# Patient Record
Sex: Male | Born: 1985 | Race: White | Hispanic: No | Marital: Single | Smoking: Never smoker
Health system: Southern US, Community
[De-identification: ages and names within clinical notes are randomized; demographics above are authoritative.]

---
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
## 2007-02-03 ENCOUNTER — Emergency Department (HOSPITAL_COMMUNITY): Admission: EM | Admit: 2007-02-03 | Discharge: 2007-02-03 | Payer: Self-pay | Admitting: Family Medicine

## 2007-04-03 ENCOUNTER — Ambulatory Visit: Payer: Self-pay | Admitting: Family Medicine

## 2007-04-04 LAB — CONVERTED CEMR LAB
Basophils Relative: 0.4 % (ref 0.0–1.0)
CO2: 31 meq/L (ref 19–32)
Calcium: 9.4 mg/dL (ref 8.4–10.5)
Eosinophils Relative: 2.6 % (ref 0.0–5.0)
GFR calc Af Amer: 99 mL/min
Glucose, Bld: 101 mg/dL — ABNORMAL HIGH (ref 70–99)
HCT: 50.2 % (ref 39.0–52.0)
Hemoglobin: 16.5 g/dL (ref 13.0–17.0)
Lymphocytes Relative: 29.1 % (ref 12.0–46.0)
MCV: 86.7 fL (ref 78.0–100.0)
Neutro Abs: 5 10*3/uL (ref 1.4–7.7)
Neutrophils Relative %: 57.9 % (ref 43.0–77.0)
Platelets: 193 10*3/uL (ref 150–400)
Sodium: 140 meq/L (ref 135–145)
WBC: 8.4 10*3/uL (ref 4.5–10.5)

## 2007-09-16 ENCOUNTER — Emergency Department (HOSPITAL_COMMUNITY): Admission: EM | Admit: 2007-09-16 | Discharge: 2007-09-16 | Payer: Self-pay | Admitting: Emergency Medicine

## 2007-09-23 ENCOUNTER — Ambulatory Visit: Payer: Self-pay | Admitting: Family Medicine

## 2007-09-24 ENCOUNTER — Encounter: Payer: Self-pay | Admitting: Family Medicine

## 2008-03-06 ENCOUNTER — Emergency Department (HOSPITAL_COMMUNITY): Admission: EM | Admit: 2008-03-06 | Discharge: 2008-03-06 | Payer: Self-pay | Admitting: Emergency Medicine

## 2008-08-09 ENCOUNTER — Emergency Department: Payer: Self-pay | Admitting: Emergency Medicine

## 2008-08-10 ENCOUNTER — Encounter (INDEPENDENT_AMBULATORY_CARE_PROVIDER_SITE_OTHER): Payer: Self-pay | Admitting: *Deleted

## 2008-09-17 ENCOUNTER — Encounter: Payer: Self-pay | Admitting: Family Medicine

## 2009-02-24 ENCOUNTER — Ambulatory Visit: Payer: Self-pay | Admitting: Family Medicine

## 2009-02-24 DIAGNOSIS — F988 Other specified behavioral and emotional disorders with onset usually occurring in childhood and adolescence: Secondary | ICD-10-CM | POA: Insufficient documentation

## 2009-02-24 HISTORY — DX: Other specified behavioral and emotional disorders with onset usually occurring in childhood and adolescence: F98.8

## 2009-03-21 ENCOUNTER — Ambulatory Visit: Payer: Self-pay | Admitting: Family Medicine

## 2009-03-21 LAB — CONVERTED CEMR LAB
Blood in Urine, dipstick: NEGATIVE
Ketones, urine, test strip: NEGATIVE
Nitrite: NEGATIVE
Urobilinogen, UA: 0.2
WBC Urine, dipstick: NEGATIVE

## 2009-04-25 ENCOUNTER — Telehealth: Payer: Self-pay | Admitting: Family Medicine

## 2009-08-24 ENCOUNTER — Telehealth: Payer: Self-pay | Admitting: Family Medicine

## 2009-08-30 ENCOUNTER — Emergency Department (HOSPITAL_COMMUNITY): Admission: EM | Admit: 2009-08-30 | Discharge: 2009-08-30 | Payer: Self-pay | Admitting: Family Medicine

## 2009-11-24 ENCOUNTER — Ambulatory Visit: Payer: Self-pay | Admitting: Family Medicine
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
## 2010-02-01 ENCOUNTER — Telehealth: Payer: Self-pay | Admitting: Family Medicine

## 2010-02-21 NOTE — Letter (Signed)
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

## 2010-02-21 NOTE — Progress Notes (Signed)
Summary: ritallin  Phone Note Refill Request Call back at [phone] Message from:  Patient on August 24, 2009 3:57 PM  Refills Requested: Medication #1:  METHYLPHENIDATE HCL CR 20 MG CR-TABS 1 by mouth daily.  Method Requested: Pick up at Office Initial call taken by: Melody Comas,  August 24, 2009 3:57 PM    Prescriptions: METHYLPHENIDATE HCL CR 20 MG CR-TABS (METHYLPHENIDATE HCL) 1 by mouth daily  #30 x 0   Entered and Authorized by:   Hannah Beat MD   Signed by:   Hannah Beat MD on 08/29/2009   Method used:   Print then Give to Patient   RxID:   [id number]

## 2010-02-21 NOTE — Assessment & Plan Note (Signed)
Summary: CDL PHYSICAL/CLE   Vital Signs:  Patient profile:   25 year old male Height:      76.25 inches Weight:      211.13 pounds BMI:     25.62 Temp:     98.3 degrees F oral Pulse rate:   80 / minute Pulse rhythm:   regular BP sitting:   138 / 88  (right arm) Cuff size:   regular  Vitals Entered By: Linde Gillis CMA Duncan Dull) (March 21, 2009 2:33 PM)  Vision Screening:Left eye w/o correction: 20 / 15 Right Eye w/o correction: 20 / 15 Both eyes w/o correction:  20/ 15  Color vision testing: normal      Vision Entered By: Linde Gillis CMA Duncan Dull) (March 21, 2009 2:43 PM)  20db HL: Left  500 hz: 20db 1000 hz: 20db 2000 hz: 20db 4000 hz: 20db Right  500 hz: 20db 1000 hz: 20db 2000 hz: 20db 4000 hz: 20db     History of Present Illness: 25 yo:  Here for CPX  Feeling fine, essentially no complaints.  Preventive Screening-Counseling & Management  Alcohol-Tobacco     Alcohol drinks/day: <1     Alcohol Counseling: not indicated; patient does not drink     Tobacco Counseling: not indicated; no tobacco use  Caffeine-Diet-Exercise     Diet Comments: good diet     Diet Counseling: not indicated; diet is assessed to be healthy     Does Patient Exercise: yes     Exercise Counseling: not indicated; exercise is adequate  Hep-HIV-STD-Contraception     HIV Risk: no risk noted     STD Risk: no risk noted     Dental Care Counseling: to seek dental care; no dental care within six months     Testicular SE Education/Counseling to perform regular STE     Sun Exposure-Excessive: yes      Sexual History:  currently monogamous.        Drug Use:  never.    Clinical Review Panels:  Immunizations   Last Tetanus Booster:  Td (11/23/1998)  CBC   WBC:  8.4 (04/03/2007)   RBC:  5.79 (04/03/2007)   Hgb:  16.5 (04/03/2007)   Hct:  50.2 (04/03/2007)   Platelets:  193 (04/03/2007)   MCV  86.7 (04/03/2007)   MCHC  32.8 (04/03/2007)   RDW  13.6 (04/03/2007)   PMN:  57.9  (04/03/2007)   Lymphs:  29.1 (04/03/2007)   Monos:  10.0 (04/03/2007)   Eosinophils:  2.6 (04/03/2007)   Basophil:  0.4 (04/03/2007)  Complete Metabolic Panel   Glucose:  101 (04/03/2007)   Sodium:  140 (04/03/2007)   Potassium:  3.8 (04/03/2007)   Chloride:  102 (04/03/2007)   CO2:  31 (04/03/2007)   BUN:  12 (04/03/2007)   Creatinine:  1.2 (04/03/2007)   Calcium:  9.4 (04/03/2007)   Allergies: 1)  ! Amoxicillin  Past History:  Past medical, surgical, family and social histories (including risk factors) reviewed, and no changes noted (except as noted below).  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 Tooth Extraction 1997  Family History: Reviewed history from 09/24/2007 and no changes required. Father:A  46 yoa Mother:A  41 yoa Sister A 13 yoa CV: NEGATIVE HBP: NEGATIVE DM: + MGM GOUT/ARTHRITIS: prostate cancer:   + lung cancer pgf (smoker) BREAST/OVARIAN/ UTERINE CANCER: COLON CANCER: DEPRESSION: NEGATIVE ETOH/DRUG ABUSE: NEGATIVE OTHER: NEGATIVE STROKE  Social History:  Reviewed history from 04/03/2007 and no changes required. 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  Cardiovascular: Denies chest pains, palpitations, syncope, dyspnea on exertion,  Respiratory: Denies cough, dyspnea at rest, excessive sputum,wheeezing GI: Denies nausea, vomiting, diarrhea, constipation, change in bowel habits, abdominal pain,  melena, hematochezia GU: Denies dysuria, hematuria, discharge, urinary frequency, urinary hesitancy, nocturia, incontinence, genital sores, decreased libido Musculoskeletal: Denies back pain, joint pain Derm: Denies rash, itching Neuro: Denies  paresthesias, frequent falls, frequent headaches, and difficulty walking.  Psych: Denies depression, anxiety Endocrine: Denies cold intolerance, heat intolerance, polydipsia, polyphagia, polyuria, and unusual weight change.  Heme: Denies enlarged lymph nodes Allergy: No hayfever   Otherwise, the pertinent positives and negatives are listed above and in the HPI, otherwise a full review of systems has been reviewed and is negative unless noted positive.    Impression & Recommendations:  Problem # 1:  HEALTH MAINTENANCE EXAM (ICD-V70.0)  The patient's preventative maintenance and recommended screening tests for an annual wellness exam were reviewed in full today. Brought up to date unless services declined.  Counselled on the importance of diet, exercise, and its role in overall health and mortality. The patient's FH and SH was reviewed, including their home life, tobacco status, and drug and alcohol status.   doing well, completed forms for him.  Orders: UA Dipstick w/o Micro (manual) (91478)  Complete Medication List: 1)  Methylphenidate Hcl Cr 20 Mg Cr-tabs (Methylphenidate hcl) .Marland Kitchen.. 1 by mouth daily   Prior Medications (reviewed today): None Current Allergies (reviewed today): ! AMOXICILLIN  Laboratory Results   Urine Tests   Date/Time Reported: March 21, 2009 2:54 PM   Routine Urinalysis   Color: yellow Appearance: Clear Glucose: negative   (Normal Range: Negative) Bilirubin: negative   (Normal Range: Negative) Ketone: negative   (Normal Range: Negative) Spec. Gravity: 1.015   (Normal Range: 1.003-1.035) Blood: negative   (Normal Range: Negative) pH: 6.5   (Normal Range: 5.0-8.0) Protein: trace   (Normal Range:  Negative) Urobilinogen: 0.2   (Normal Range: 0-1) Nitrite: negative   (Normal Range: Negative) Leukocyte Esterace: negative   (Normal Range: Negative)      Physical Exam General Appearance: well developed, well nourished, no acute distress Eyes: conjunctiva and lids normal, PERRLA, EOMI Ears, Nose, Mouth, Throat: TM clear, nares clear, oral exam WNL Neck: supple, no lymphadenopathy, no thyromegaly, no JVD Respiratory: clear to auscultation and percussion, respiratory effort normal Cardiovascular: regular rate and rhythm, S1-S2, no murmur, rub or gallop, no bruits, peripheral pulses normal and symmetric, no cyanosis, clubbing, edema or varicosities Chest: no scars, masses, tenderness; no asymmetry, skin changes, nipple discharge, no gynecomastia   Gastrointestinal: soft, non-tender; no hepatosplenomegaly, masses; active bowel sounds all quadrants,  Genitourinary: no hernia, testicular mass, penile discharge, priapism  Lymphatic: no cervical, axillary or inguinal adenopathy Musculoskeletal: gait normal, muscle tone and strength WNL, no joint swelling, effusions, discoloration, crepitus  Skin: clear, good turgor, color WNL, no rashes, lesions, or ulcerations Neurologic: normal mental status, normal reflexes, normal strength, sensation, and motion Psychiatric: alert; oriented to person, place and time Other Exam:

## 2010-02-21 NOTE — Letter (Signed)
Summary: ADHD Questionnaire/[facility] [facility]  ADHD Questionnaire/[facility]   Imported By: Lanelle Bal 03/01/2009 12:38:49  _____________________________________________________________________  External Attachment:    Type:   Image     Comment:   External Document

## 2010-02-21 NOTE — Assessment & Plan Note (Signed)
Summary: F/U ON RITLIN/DLO   Vital Signs:  Patient profile:   25 year old male Height:      76.25 inches Weight:      206.0 pounds BMI:     25.00 Temp:     99.0 degrees F oral Pulse rate:   84 / minute Pulse rhythm:   regular BP sitting:   110 / 78  (left arm) Cuff size:   regular  Vitals Entered By: Benny Lennert CMA Duncan Dull) (November 24, 2009 3:24 PM)  History of Present Illness: Chief complaint follow up ritalin  25 year old male:  Was not sleeping and was not eating. Felt spacy and out of it all the time.  Lost weight.  Was  Allergies: 1)  ! Amoxicillin   Impression & Recommendations:  Problem # 1:  ADD (ICD-314.00) >15 minutes spent in face to face time with patient, >50% spent in counselling or coordination of care: patient did do better from an irritability standpoint, concentration standpoint, doing all his work better when  taking the Ritalin, however he did have to many side effects, decreased sleep, weight loss, loss of appetite. This is a 20 mg dose.  We're to cut this down to 10 mg p.o. daily  with the extended release version. Followup in 2 months.  Complete Medication List: 1)  Methylphenidate Hcl 10 Mg Cr-tabs (Methylphenidate hcl) .Marland Kitchen.. 1 by mouth each morning  Patient Instructions: 1)  recheck 2 months Prescriptions: METHYLPHENIDATE HCL 10 MG CR-TABS (METHYLPHENIDATE HCL) 1 by mouth each morning  #30 x 0   Entered and Authorized by:   Hannah Beat MD   Signed by:   Hannah Beat MD on 11/24/2009   Method used:   Print then Give to Patient   RxID:   [id number]    Orders Added: 1)  Est. Patient Level III [14782]    Prior Medications (reviewed today): None Current Allergies (reviewed today): ! AMOXICILLIN

## 2010-02-21 NOTE — Assessment & Plan Note (Signed)
Summary: TEST FOR ADHD/CLE R/S FROM 02/14/09   Vital Signs:  Patient profile:   25 year old male Weight:      214.6 pounds BMI:     17.14 Temp:     98.1 degrees F oral Pulse rate:   84 / minute Pulse rhythm:   regular BP sitting:   90 / 60  (left arm) Cuff size:   large  Vitals Entered By: Linde Gillis CMA Duncan Dull) (February 24, 2009 3:47 PM) CC: test for ADHD   History of Present Illness: 25 year old male:  Just going to [employer], having some difficuty focusing and thinking and getting into them. Will start thinking about   If interested will be able to read  Moved out and living with a friend.   Current Problems (verified): 1)  Add  (ICD-314.00)  Allergies: 1)  ! Amoxicillin  Past History:  Past medical, surgical, family and social histories (including risk factors) reviewed, and no changes noted (except as noted below).  Past Medical History: ADD  Past Surgical History: Reviewed history from 09/24/2007 and no changes required. Tooth Extraction 1997  Family History: Reviewed history from 09/24/2007 and no changes required. Father:A  46 yoa Mother:A  4 yoa Sister A 13 yoa CV: NEGATIVE HBP: NEGATIVE DM: + MGM GOUT/ARTHRITIS: prostate cancer:   + lung cancer pgf (smoker) BREAST/OVARIAN/ UTERINE CANCER: COLON CANCER: DEPRESSION: NEGATIVE ETOH/DRUG ABUSE: NEGATIVE OTHER: NEGATIVE STROKE  Social History: Reviewed history from 04/03/2007 and no changes required. 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  The patient's adult ADD questionnaire is reviewed, and it is significantly positive. He is recently started  college at [employer], and he is having a great deal of difficulty focusing, and maintaining his focusing on schoolwork. He is had this problem since he was quite young throughout school age, and was told younger than he did likely have ADD. He and his parents never pursued this. 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 He is unable to concentrate reading, paying attention in class, and doing schoolwork.  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  Will start low-dose stimulants and have him followup to evaluate their impact.  Complete Medication List: 1)  Methylphenidate Hcl Cr 20 Mg Cr-tabs (Methylphenidate hcl) .Marland Kitchen.. 1 by mouth daily Prescriptions: METHYLPHENIDATE HCL CR 20 MG CR-TABS (METHYLPHENIDATE HCL) 1 by mouth daily  #30 x 0   Entered and Authorized by:   Hannah Beat MD   Signed by:   Hannah Beat MD on 02/24/2009   Method used:   Print then Give to Patient   RxID:   [id number]   Current Allergies (reviewed today): ! AMOXICILLIN

## 2010-02-21 NOTE — Progress Notes (Signed)
Summary: needs refill on ritalin  Phone Note Refill Request Message from:  mother  Refills Requested: Medication #1:  METHYLPHENIDATE HCL CR 20 MG CR-TABS 1 by mouth daily. Please call pt at [phone] when script is ready.  Initial call taken by: Lowella Petties CMA,  April 25, 2009 9:36 AM    Prescriptions: METHYLPHENIDATE HCL CR 20 MG CR-TABS (METHYLPHENIDATE HCL) 1 by mouth daily  #30 x 0   Entered and Authorized by:   Hannah Beat MD   Signed by:   Hannah Beat MD on 04/25/2009   Method used:   Print then Give to Patient   RxID:   [id number]   Appended Document: needs refill on ritalin Left message on cell phone voicemail that Rx ready for pick up, will be left at front desk.

## 2010-02-23 NOTE — Progress Notes (Signed)
Summary: refill request for ritalin  Phone Note Refill Request Call back at [phone] Message from:  mother  Jearld Fenton Requested: Medication #1:  METHYLPHENIDATE HCL 10 MG CR-TABS 1 by mouth each morning. Please call when ready.  Initial call taken by: Lowella Petties CMA, AAMA,  February 01, 2010 3:24 PM    Prescriptions: METHYLPHENIDATE HCL 10 MG CR-TABS (METHYLPHENIDATE HCL) 1 by mouth each morning  #30 x 0   Entered and Authorized by:   Hannah Beat MD   Signed by:   Hannah Beat MD on 02/01/2010   Method used:   Print then Give to Patient   RxID:   [id number]

## 2010-04-04 ENCOUNTER — Telehealth: Payer: Self-pay | Admitting: Family Medicine

## 2010-04-10 ENCOUNTER — Telehealth: Payer: Self-pay | Admitting: Family Medicine

## 2010-04-11 NOTE — Progress Notes (Signed)
Summary: needs refill on ritalin  Phone Note Refill Request Call back at Home Phone [phone] Message from:  Patient  Refills Requested: Medication #1:  METHYLPHENIDATE HCL 10 MG CR-TABS 1 by mouth each morning. Please call when ready.  OK to wait till tomorrow.  Initial call taken by: Lowella Petties CMA, AAMA,  April 04, 2010 3:02 PM    Prescriptions: METHYLPHENIDATE HCL 10 MG CR-TABS (METHYLPHENIDATE HCL) 1 by mouth each morning  #30 x 0   Entered and Authorized by:   Hannah Beat MD   Signed by:   Hannah Beat MD on 04/05/2010   Method used:   Print then Give to Patient   RxID:   [id number]

## 2010-04-20 NOTE — Progress Notes (Signed)
Summary: clarification needed for ritalin script  Phone Note From Pharmacy   Caller: [facility]  [phone] Summary of Call: Pharmacy is asking for clarification on ritalin script, script was written for CR, pharmacist says this doesnt come in CR.              Lowella Petties CMA, AAMA  April 10, 2010 9:49 AM   Follow-up for Phone Call        converted to capsule form per phamD - a generic capsule exists Follow-up by: Hannah Beat MD,  April 10, 2010 10:36 AM    New/Updated Medications: METADATE CD 10 MG CR-CAPS (METHYLPHENIDATE HCL) 1 capsule by mouth daily Prescriptions: METADATE CD 10 MG CR-CAPS (METHYLPHENIDATE HCL) 1 capsule by mouth daily  #30 x 0   Entered and Authorized by:   Hannah Beat MD   Signed by:   Hannah Beat MD on 04/10/2010   Method used:   Historical   RxID:   [id number]

## 2010-10-12 LAB — POCT INFECTIOUS MONO SCREEN: Mono Screen: POSITIVE — AB

## 2012-01-17 ENCOUNTER — Telehealth: Payer: Self-pay

## 2012-01-17 NOTE — Telephone Encounter (Signed)
rx called to pharmacy 

## 2012-01-17 NOTE — Telephone Encounter (Signed)
Transdermal Scopolamine Patch Apply 1 patch q 72 hours prn motion sickness Disp: #4, 1 refills  

## 2012-01-17 NOTE — Telephone Encounter (Signed)
pts mother left note requesting rx for motion sickness patch to [facility] pharmacy; leaving 01/18/12 for 6 day cruise. Pt last seen 11/24/09.Please advise. Pts mother request call back when med sent to pharmacy.

## 2012-03-10 ENCOUNTER — Ambulatory Visit (INDEPENDENT_AMBULATORY_CARE_PROVIDER_SITE_OTHER): Payer: 59 | Admitting: Family Medicine

## 2012-03-10 ENCOUNTER — Encounter: Payer: Self-pay | Admitting: Family Medicine

## 2012-03-10 VITALS — BP 112/76 | HR 62 | Temp 97.6°F | Ht 76.25 in | Wt 209.5 lb

## 2012-03-10 DIAGNOSIS — Z13220 Encounter for screening for lipoid disorders: Secondary | ICD-10-CM

## 2012-03-10 DIAGNOSIS — S63659A Sprain of metacarpophalangeal joint of unspecified finger, initial encounter: Secondary | ICD-10-CM

## 2012-03-10 DIAGNOSIS — S63641A Sprain of metacarpophalangeal joint of right thumb, initial encounter: Secondary | ICD-10-CM

## 2012-03-10 DIAGNOSIS — Z209 Contact with and (suspected) exposure to unspecified communicable disease: Secondary | ICD-10-CM

## 2012-03-10 DIAGNOSIS — Z0000 Encounter for general adult medical examination without abnormal findings: Secondary | ICD-10-CM

## 2012-03-10 DIAGNOSIS — Z131 Encounter for screening for diabetes mellitus: Secondary | ICD-10-CM

## 2012-03-10 LAB — LIPID PANEL
HDL: 39.9 mg/dL (ref >39.00)
LDL Cholesterol: 103 mg/dL — ABNORMAL HIGH (ref 0–99)
Total CHOL/HDL Ratio: 4
Triglycerides: 100 mg/dL (ref 0.0–149.0)
VLDL: 20 mg/dL (ref 0.0–40.0)

## 2012-03-10 LAB — BASIC METABOLIC PANEL
CO2: 29 mEq/L (ref 19–32)
Chloride: 106 mEq/L (ref 96–112)
Sodium: 141 mEq/L (ref 135–145)
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
## 2012-03-11 ENCOUNTER — Encounter: Payer: Self-pay | Admitting: *Deleted

## 2012-11-20 ENCOUNTER — Ambulatory Visit (INDEPENDENT_AMBULATORY_CARE_PROVIDER_SITE_OTHER): Payer: 59 | Admitting: Family Medicine

## 2012-11-20 ENCOUNTER — Encounter: Payer: Self-pay | Admitting: Family Medicine

## 2012-11-20 VITALS — BP 110/70 | HR 75 | Temp 98.3°F | Ht 76.25 in | Wt 211.5 lb

## 2012-11-20 DIAGNOSIS — S29012A Strain of muscle and tendon of back wall of thorax, initial encounter: Secondary | ICD-10-CM

## 2012-11-20 DIAGNOSIS — S239XXA Sprain of unspecified parts of thorax, initial encounter: Secondary | ICD-10-CM

## 2012-11-20 MED ORDER — OXAPROZIN 600 MG PO TABS: 1200.0000 mg | ORAL_TABLET | Freq: Every day | ORAL | Status: DC

## 2012-11-20 MED ORDER — CYCLOBENZAPRINE HCL 10 MG PO TABS: 5.0000 mg | ORAL_TABLET | Freq: Every day | ORAL | Status: DC

## 2012-11-20 NOTE — Progress Notes (Signed)
Date:  11/20/2012   Name:  Isaac Fisher   DOB:  April 11, 1985   MRN:  [id number] Gender: male Age: 27 y.o.  Primary Physician:  Hannah Beat, MD   Chief Complaint: Shoulder Pain   History of Present Illness:  Isaac Fisher is a 27 y.o. very pleasant male patient who presents with the following:  L  Over the last year and a half, has had a  Pain like pulling a guitar string in shoulder. Sometimes if aggravated, then it will hurt when doing something. Position will make it hurt. Framing houses will hurt it. All posterior shoulder blade area. No specific injury or fall. No numbness or tingling.  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   Past Medical History, Surgical History, Social History, Family History, Problem List, Medications, and Allergies have been reviewed and updated if relevant.  No current outpatient prescriptions on file prior to visit.   No current facility-administered medications on file prior to visit.    Review of Systems:  GEN: No fevers, chills. Nontoxic. Primarily MSK c/o today. MSK: Detailed in the HPI GI: tolerating PO intake without difficulty Neuro: No numbness, parasthesias, or tingling associated. Otherwise the pertinent positives of the ROS are noted above.    Physical Examination: BP 110/70  Pulse 75  Temp(Src) 98.3 F (36.8 C) (Oral)  Ht 6' 4.25" (1.937 m)  Wt 211 lb 8 oz (95.936 kg)  BMI 25.57 kg/m2   GEN: WDWN, NAD, Non-toxic, Alert & Oriented x 3 HEENT: Atraumatic, Normocephalic.  Ears and Nose: No external deformity. EXTR: No clubbing/cyanosis/edema NEURO: Normal gait.  PSYCH: Normally interactive. Conversant. Not depressed or anxious appearing.  Calm demeanor.   Shoulder: L Inspection: No muscle wasting Ecchymosis/edema: neg  AC joint, scapula, clavicle: NT Cervical spine: NT, full ROM Spurling's: neg Abduction: full, 5/5 Flexion: full, 5/5 IR, full, lift-off: 5/5 ER at  neutral: full, 5/5 AC crossover and compression: neg Neer: neg Hawkins: neg Drop Test: neg Empty Can: neg Supraspinatus insertion: NT Bicipital groove: NT Speed's: neg Yergason's: neg Sulcus sign: neg Scapular dyskinesis: MARKED PAIN ADJACENT TO SCAPULA IN LOWER TRAP OR UPPER RHOMBOID.  C5-T1 intact Sensation intact Grip 5/5   Assessment and Plan:  Rhomboid muscle strain, initial encounter  I suspect all overuse from occupation. Trap/rhomboid  I reviewed a good scapular stabilization program, too.  There are no Patient Instructions on file for this visit.  Orders Today:  No orders of the defined types were placed in this encounter.    Updated Medication List: (Includes new medications, updates to list, dose adjustments) Meds ordered this encounter  Medications  . oxaprozin (DAYPRO) 600 MG tablet    Sig: Take 2 tablets (1,200 mg total) by mouth daily.    Dispense:  60 tablet    Refill:  2  . cyclobenzaprine (FLEXERIL) 10 MG tablet    Sig: Take 0.5-1 tablets (5-10 mg total) by mouth at bedtime.    Dispense:  30 tablet    Refill:  2    Updated Medication List:   Medication List       This list is accurate as of: 11/20/12 11:59 PM.  Always use your most recent med list.               cyclobenzaprine 10 MG tablet  Commonly known as:  FLEXERIL  Take 0.5-1 tablets (5-10 mg total) by mouth at bedtime.     oxaprozin 600 MG tablet  Commonly known as:  DAYPRO  Take 2 tablets (1,200 mg total) by mouth daily.          Signed,  Elpidio Galea. Mikkel Charrette, MD, CAQ Sports Medicine  [facility] [address] [place] [address] Phone: [phone] Fax: [phone]

## 2013-02-18 ENCOUNTER — Telehealth: Payer: Self-pay | Admitting: Family Medicine

## 2013-02-18 ENCOUNTER — Encounter: Payer: Self-pay | Admitting: Family Medicine

## 2013-02-18 NOTE — Telephone Encounter (Signed)
Pt requesting last physical or a letter to give clearance saying it's ok to perform his job normally (he is a [employer] architecttruck driver) to be faxed to Dr. Katharina CaperBeverly Knott at [phone].

## 2013-02-18 NOTE — Telephone Encounter (Signed)
Letter faxed to Dr. Katharina CaperBeverly Knott at [phone].

## 2013-02-18 NOTE — Telephone Encounter (Signed)
i wrote this letter, but i had trouble printing. Please print and help send. thahnks.

## 2015-11-18 ENCOUNTER — Encounter: Payer: Self-pay | Admitting: Primary Care

## 2015-11-18 ENCOUNTER — Ambulatory Visit (INDEPENDENT_AMBULATORY_CARE_PROVIDER_SITE_OTHER): Payer: BLUE CROSS/BLUE SHIELD | Admitting: Primary Care

## 2015-11-18 ENCOUNTER — Telehealth: Payer: Self-pay | Admitting: Radiology

## 2015-11-18 VITALS — BP 130/84 | HR 72 | Temp 98.4°F | Ht 76.25 in | Wt 227.1 lb

## 2015-11-18 DIAGNOSIS — R197 Diarrhea, unspecified: Secondary | ICD-10-CM

## 2015-11-18 DIAGNOSIS — R112 Nausea with vomiting, unspecified: Secondary | ICD-10-CM | POA: Diagnosis not present

## 2015-11-18 LAB — CBC WITH DIFFERENTIAL/PLATELET
Basophils Absolute: 0 10*3/uL (ref 0.0–0.1)
Basophils Relative: 0.1 % (ref 0.0–3.0)
EOS PCT: 0.8 % (ref 0.0–5.0)
Eosinophils Absolute: 0.1 10*3/uL (ref 0.0–0.7)
HCT: 53.8 % — ABNORMAL HIGH (ref 39.0–52.0)
Hemoglobin: 18.4 g/dL (ref 13.0–17.0)
LYMPHS ABS: 0.8 10*3/uL (ref 0.7–4.0)
Lymphocytes Relative: 8.9 % — ABNORMAL LOW (ref 12.0–46.0)
MCHC: 34.1 g/dL (ref 30.0–36.0)
MCV: 88.2 fl (ref 78.0–100.0)
MONO ABS: 0.9 10*3/uL (ref 0.1–1.0)
Monocytes Relative: 9.3 % (ref 3.0–12.0)
NEUTROS PCT: 80.9 % — AB (ref 43.0–77.0)
Neutro Abs: 7.4 10*3/uL (ref 1.4–7.7)
Platelets: 154 10*3/uL (ref 150.0–400.0)
RBC: 6.1 Mil/uL — AB (ref 4.22–5.81)
RDW: 13.2 % (ref 11.5–15.5)
WBC: 9.2 10*3/uL (ref 4.0–10.5)

## 2015-11-18 LAB — COMPREHENSIVE METABOLIC PANEL
ALK PHOS: 69 U/L (ref 39–117)
ALT: 34 U/L (ref 0–53)
AST: 33 U/L (ref 0–37)
Albumin: 4.6 g/dL (ref 3.5–5.2)
BUN: 15 mg/dL (ref 6–23)
CO2: 30 mEq/L (ref 19–32)
Calcium: 9.5 mg/dL (ref 8.4–10.5)
Chloride: 98 mEq/L (ref 96–112)
Creatinine, Ser: 1.22 mg/dL (ref 0.40–1.50)
GFR: 74.74 mL/min (ref >60.00)
GLUCOSE: 103 mg/dL — AB (ref 70–99)
POTASSIUM: 4.4 meq/L (ref 3.5–5.1)
SODIUM: 135 meq/L (ref 135–145)
TOTAL PROTEIN: 7.9 g/dL (ref 6.0–8.3)
Total Bilirubin: 0.5 mg/dL (ref 0.2–1.2)

## 2015-11-18 MED ORDER — ONDANSETRON 4 MG PO TBDP: 4.0000 mg | ORAL_TABLET | Freq: Three times a day (TID) | ORAL | 0 refills | Status: DC | PRN

## 2015-11-18 NOTE — Telephone Encounter (Signed)
Most likely from acute illness. No prior history. Will monitor symptoms.

## 2015-11-18 NOTE — Telephone Encounter (Signed)
I would suspect this would be from acute illness, diarrhea, etc - I will send to Mrs. Clark who saw him.

## 2015-11-18 NOTE — Telephone Encounter (Signed)
[facility] called a critical HGB - 18.2. Results given to Vernona RiegerKatherine Clark, NP

## 2015-11-18 NOTE — Patient Instructions (Signed)
Your symptoms are representative of a viral illness which will resolve on its own over time. Our goal is to treat your symptoms in order to aid your body in the healing process and to make you more comfortable.   Start Zofran (ondansetron) 4 mg tablets for nausea. Melt 1 tablet in your mouth every 8 hours as needed for nausea/vomiting.  Ensure you are staying hydrated and advance your diet as tolerated. Take a look at the information below regarding your diet.  Complete lab work prior to leaving today. I will notify you of your results once received.   Please go to the hospital if you start to vomit, your diarrhea does not improve, you cannot drink any fluids. Avoid medications like Imodium as discussed.   It was a pleasure meeting you!    Food Choices to Help Relieve Diarrhea, Adult When you have diarrhea, the foods you eat and your eating habits are very important. Choosing the right foods and drinks can help relieve diarrhea. Also, because diarrhea can last up to 7 days, you need to replace lost fluids and electrolytes (such as sodium, potassium, and chloride) in order to help prevent dehydration.  WHAT GENERAL GUIDELINES DO I NEED TO FOLLOW?  Slowly drink 1 cup (8 oz) of fluid for each episode of diarrhea. If you are getting enough fluid, your urine will be clear or pale yellow.  Eat starchy foods. Some good choices include white rice, white toast, pasta, low-fiber cereal, baked potatoes (without the skin), saltine crackers, and bagels.  Avoid large servings of any cooked vegetables.  Limit fruit to two servings per day. A serving is  cup or 1 small piece.  Choose foods with less than 2 g of fiber per serving.  Limit fats to less than 8 tsp (38 g) per day.  Avoid fried foods.  Eat foods that have probiotics in them. Probiotics can be found in certain dairy products.  Avoid foods and beverages that may increase the speed at which food moves through the stomach and intestines  (gastrointestinal tract). Things to avoid include:  High-fiber foods, such as dried fruit, raw fruits and vegetables, nuts, seeds, and whole grain foods.  Spicy foods and high-fat foods.  Foods and beverages sweetened with high-fructose corn syrup, honey, or sugar alcohols such as xylitol, sorbitol, and mannitol. WHAT FOODS ARE RECOMMENDED? Grains White rice. White, JamaicaFrench, or pita breads (fresh or toasted), including plain rolls, buns, or bagels. White pasta. Saltine, soda, or graham crackers. Pretzels. Low-fiber cereal. Cooked cereals made with water (such as cornmeal, farina, or cream cereals). Plain muffins. Matzo. Melba toast. Zwieback.  Vegetables Potatoes (without the skin). Strained tomato and vegetable juices. Most well-cooked and canned vegetables without seeds. Tender lettuce. Fruits Cooked or canned applesauce, apricots, cherries, fruit cocktail, grapefruit, peaches, pears, or plums. Fresh bananas, apples without skin, cherries, grapes, cantaloupe, grapefruit, peaches, oranges, or plums.  Meat and Other Protein Products Baked or boiled chicken. Eggs. Tofu. Fish. Seafood. Smooth peanut butter. Ground or well-cooked tender beef, ham, veal, lamb, pork, or poultry.  Dairy Plain yogurt, kefir, and unsweetened liquid yogurt. Lactose-free milk, buttermilk, or soy milk. Plain hard cheese. Beverages Sport drinks. Clear broths. Diluted fruit juices (except prune). Regular, caffeine-free sodas such as ginger ale. Water. Decaffeinated teas. Oral rehydration solutions. Sugar-free beverages not sweetened with sugar alcohols. Other Bouillon, broth, or soups made from recommended foods.  The items listed above may not be a complete list of recommended foods or beverages. Contact your dietitian for  more options. WHAT FOODS ARE NOT RECOMMENDED? Grains Whole grain, whole wheat, bran, or rye breads, rolls, pastas, crackers, and cereals. Wild or brown rice. Cereals that contain more than 2 g of fiber  per serving. Corn tortillas or taco shells. Cooked or dry oatmeal. Granola. Popcorn. Vegetables Raw vegetables. Cabbage, broccoli, Brussels sprouts, artichokes, baked beans, beet greens, corn, kale, legumes, peas, sweet potatoes, and yams. Potato skins. Cooked spinach and cabbage. Fruits Dried fruit, including raisins and dates. Raw fruits. Stewed or dried prunes. Fresh apples with skin, apricots, mangoes, pears, raspberries, and strawberries.  Meat and Other Protein Products Chunky peanut butter. Nuts and seeds. Beans and lentils. Tomasa Blase.  Dairy High-fat cheeses. Milk, chocolate milk, and beverages made with milk, such as milk shakes. Cream. Ice cream. Sweets and Desserts Sweet rolls, doughnuts, and sweet breads. Pancakes and waffles. Fats and Oils Butter. Cream sauces. Margarine. Salad oils. Plain salad dressings. Olives. Avocados.  Beverages Caffeinated beverages (such as coffee, tea, soda, or energy drinks). Alcoholic beverages. Fruit juices with pulp. Prune juice. Soft drinks sweetened with high-fructose corn syrup or sugar alcohols. Other Coconut. Hot sauce. Chili powder. Mayonnaise. Gravy. Cream-based or milk-based soups.  The items listed above may not be a complete list of foods and beverages to avoid. Contact your dietitian for more information. WHAT SHOULD I DO IF I BECOME DEHYDRATED? Diarrhea can sometimes lead to dehydration. Signs of dehydration include dark urine and dry mouth and skin. If you think you are dehydrated, you should rehydrate with an oral rehydration solution. These solutions can be purchased at pharmacies, retail stores, or online.  Drink -1 cup (120-240 mL) of oral rehydration solution each time you have an episode of diarrhea. If drinking this amount makes your diarrhea worse, try drinking smaller amounts more often. For example, drink 1-3 tsp (5-15 mL) every 5-10 minutes.  A general rule for staying hydrated is to drink 1-2 L of fluid per day. Talk to your  health care provider about the specific amount you should be drinking each day. Drink enough fluids to keep your urine clear or pale yellow.   This information is not intended to replace advice given to you by your health care provider. Make sure you discuss any questions you have with your health care provider.   Document Released: 03/31/2003 Document Revised: 01/29/2014 Document Reviewed: 12/01/2012 Elsevier Interactive Patient Education [employer]

## 2015-11-18 NOTE — Progress Notes (Signed)
Subjective:    Patient ID: Isaac Fisher, male    DOB: 03/06/1985, 30 y.o.   MRN: [id number]  HPI  Isaac Fisher is a 30 year old male who presents today with a chief complaint of nausea, vomiting, diarrhea.   His symptoms began with body aches and decreased appetite on Wednesday this week. His symptoms continued Thursday as he was still unable to eat much. He ate a pack of crackers later that evening on Thursday and vomited once. He then began to have diarrhea that began Thursday night and has persisted.   He's not vomited since Thursday and his body aches have improved. He's been unable to eat anything since Thursday as he has no appetite and is nauseated. He continues to have numerous episodes of diarrhea (20 episodes in 24 hours). He's staying hydrated with water, orange juice, ginger ale. Overall today he's feeling slightly better, but has continued to experience a reduction in appetite. He denies fevers, abdominal pain, rectal bleeding, weakness, palpitations, recent travel, sick contacts, changes in diet.    Review of Systems  Constitutional: Positive for appetite change, chills and fatigue. Negative for fever.  Respiratory: Negative for cough.   Gastrointestinal: Positive for diarrhea, nausea and vomiting. Negative for abdominal pain.  Neurological: Negative for weakness.       No past medical history on file.   Social History   Social History  . Marital status: Single    Spouse name: N/A  . Number of children: N/A  . Years of education: N/A   Occupational History  . Not on file.   Social History Main Topics  . Smoking status: Never Smoker  . Smokeless tobacco: Never Used  . Alcohol use Yes  . Drug use:      Comment: marijuana  . Sexual activity: Not on file   Other Topics Concern  . Not on file   Social History Narrative  . No narrative on file    No past surgical history on file.  No family history on file.  Allergies  Allergen Reactions  .  Amoxicillin     REACTION: tolerates keflex    No current outpatient prescriptions on file prior to visit.   No current facility-administered medications on file prior to visit.     BP 130/84   Pulse 72   Temp 98.4 F (36.9 C) (Oral)   Ht 6' 4.25" (1.937 m)   Wt 227 lb 1.9 oz (103 kg)   SpO2 98%   BMI 27.46 kg/m    Objective:   Physical Exam  Constitutional: He appears well-nourished. He does not have a sickly appearance.  Neck: Neck supple.  Cardiovascular: Normal rate and regular rhythm.   Pulmonary/Chest: Effort normal and breath sounds normal.  Abdominal: Soft. Normal appearance. Bowel sounds are increased. There is tenderness in the left lower quadrant.  Skin: Skin is warm and dry.          Assessment & Plan:  Viral Gastroenteritis:   Nausea, vomiting, diarrhea x 2 days. Overall feeling slightly improved, diarrhea and decreased appetite continues. Exam today mostly unremarkable, stable vitals, does not appear acutely ill.  Suspect viral involvement and will treat with supportive measures. Will obtain CBC and CMP to rule out any other complication. Rx for Zofran sent to pharmacy. Discussed to refrain from OTC use of Imodium at this point in order for viral shedding. Discussed BRAT diet, fluids, rest. ED precautions provided. He is stable for outpatient treatment.  Morrie Sheldonlark,Varsha Knock Kendal, NP

## 2015-11-18 NOTE — Progress Notes (Signed)
Pre visit review using our clinic review tool, if applicable. No additional management support is needed unless otherwise documented below in the visit note. 

## 2015-11-22 ENCOUNTER — Encounter: Payer: Self-pay | Admitting: Internal Medicine

## 2015-11-22 ENCOUNTER — Ambulatory Visit (INDEPENDENT_AMBULATORY_CARE_PROVIDER_SITE_OTHER): Payer: BLUE CROSS/BLUE SHIELD | Admitting: Internal Medicine

## 2015-11-22 VITALS — BP 122/82 | HR 79 | Temp 98.1°F | Wt 219.5 lb

## 2015-11-22 DIAGNOSIS — A084 Viral intestinal infection, unspecified: Secondary | ICD-10-CM | POA: Diagnosis not present

## 2015-11-22 DIAGNOSIS — R197 Diarrhea, unspecified: Secondary | ICD-10-CM

## 2015-11-22 DIAGNOSIS — R112 Nausea with vomiting, unspecified: Secondary | ICD-10-CM | POA: Diagnosis not present

## 2015-11-22 NOTE — Patient Instructions (Signed)

## 2015-11-22 NOTE — Progress Notes (Signed)
Subjective:    Patient ID: Isaac Fisher, male    DOB: 04/19/1985, 30 y.o.   MRN: [id number]  HPI  Pt presents to the clinic today with c/o ongoing vomiting and loose stools. He reports he has been vomiting after anything he eats, he last vomited last night. His stool is loose and watery. He denies abdominal pain or blood in his stool. He denies fever, chills or body aches. He denies recent travel, antibiotic use or changes in diet or medication. He saw Mayra ReelKate Clark, NP for the same on 11/18/15- note reviewed. He was diagnosed with viral gastroenteritis and advised to take Zofran as needed. He reports the Zofran only gave minimal relief.  Review of Systems      No past medical history on file.  Current Outpatient Prescriptions  Medication Sig Dispense Refill  . ondansetron (ZOFRAN ODT) 4 MG disintegrating tablet Take 1 tablet (4 mg total) by mouth every 8 (eight) hours as needed for nausea or vomiting. 20 tablet 0   No current facility-administered medications for this visit.     Allergies  Allergen Reactions  . Amoxicillin     REACTION: tolerates keflex    No family history on file.  Social History   Social History  . Marital status: Single    Spouse name: N/A  . Number of children: N/A  . Years of education: N/A   Occupational History  . Not on file.   Social History Main Topics  . Smoking status: Never Smoker  . Smokeless tobacco: Never Used  . Alcohol use Yes  . Drug use:      Comment: marijuana  . Sexual activity: Not on file   Other Topics Concern  . Not on file   Social History Narrative  . No narrative on file     Constitutional: Denies fever, malaise, fatigue, headache or abrupt weight changes.  Respiratory: Denies difficulty breathing, shortness of breath, cough or sputum production.   Cardiovascular: Denies chest pain, chest tightness, palpitations or swelling in the hands or feet.  Gastrointestinal: Pt reports nausea, vomiting and diarrhea.  Denies abdominal pain, bloating, constipation, or blood in the stool.    No other specific complaints in a complete review of systems (except as listed in HPI above).  Objective:   Physical Exam  BP 122/82   Pulse 79   Temp 98.1 F (36.7 C) (Oral)   Wt 219 lb 8 oz (99.6 kg)   SpO2 98%   BMI 26.54 kg/m  Wt Readings from Last 3 Encounters:  11/22/15 219 lb 8 oz (99.6 kg)  11/18/15 227 lb 1.9 oz (103 kg)  11/20/12 211 lb 8 oz (95.9 kg)    General: Appears his stated age, well developed, well nourished in NAD. Cardiovascular: Normal rate and rhythm. S1,S2 noted.  No murmur, rubs or gallops noted. Pulmonary/Chest: Normal effort and positive vesicular breath sounds. No respiratory distress. No wheezes, rales or ronchi noted.  Abdomen: Soft and mildly tender in the RLQ. Normal bowel sounds. No distention or masses noted.  Neurological: Alert and oriented.    BMET    Component Value Date/Time   NA 135 11/18/2015 1045   K 4.4 11/18/2015 1045   CL 98 11/18/2015 1045   CO2 30 11/18/2015 1045   GLUCOSE 103 (H) 11/18/2015 1045   BUN 15 11/18/2015 1045   CREATININE 1.22 11/18/2015 1045   CALCIUM 9.5 11/18/2015 1045   GFRNONAA 82 04/03/2007 1433   GFRAA 99 04/03/2007 1433  Lipid Panel     Component Value Date/Time   CHOL 163 03/10/2012 0928   TRIG 100.0 03/10/2012 0928   HDL 39.90 03/10/2012 0928   CHOLHDL 4 03/10/2012 0928   VLDL 20.0 03/10/2012 0928   LDLCALC 103 (H) 03/10/2012 0928    CBC    Component Value Date/Time   WBC 9.2 11/18/2015 1045   RBC 6.10 (H) 11/18/2015 1045   HGB 18.4 Repeated and verified X2. (HH) 11/18/2015 1045   HCT 53.8 (H) 11/18/2015 1045   PLT 154.0 11/18/2015 1045   MCV 88.2 11/18/2015 1045   MCHC 34.1 11/18/2015 1045   RDW 13.2 11/18/2015 1045   LYMPHSABS 0.8 11/18/2015 1045   MONOABS 0.9 11/18/2015 1045   EOSABS 0.1 11/18/2015 1045   BASOSABS 0.0 11/18/2015 1045    Hgb A1C No results found for: HGBA1C      Assessment & Plan:    Nausea, vomiting and diarrhea:  Slowly getting better Discussed increasing his Zofran to 8 mg Q8H prn Avoid dairy, spicy, greasy foods If no improvement by Thursday will consider stool studies Discussed the importance of cleaning the bathroom thoroughly and washing your hands frequently.  RTC as needed or if symptoms persist or worsen Levert Heslop, NP

## 2017-05-03 ENCOUNTER — Encounter: Payer: Self-pay | Admitting: Internal Medicine

## 2017-05-03 ENCOUNTER — Ambulatory Visit (INDEPENDENT_AMBULATORY_CARE_PROVIDER_SITE_OTHER): Payer: BLUE CROSS/BLUE SHIELD | Admitting: Internal Medicine

## 2017-05-03 ENCOUNTER — Ambulatory Visit (INDEPENDENT_AMBULATORY_CARE_PROVIDER_SITE_OTHER)
Admission: RE | Admit: 2017-05-03 | Discharge: 2017-05-03 | Disposition: A | Discharge status: Home / Self Care | Payer: BLUE CROSS/BLUE SHIELD | Source: Ambulatory Visit | Attending: Internal Medicine | Admitting: Internal Medicine

## 2017-05-03 VITALS — BP 122/80 | HR 68 | Temp 98.1°F | Wt 240.0 lb

## 2017-05-03 DIAGNOSIS — M79672 Pain in left foot: Secondary | ICD-10-CM | POA: Diagnosis not present

## 2017-05-03 NOTE — Progress Notes (Signed)
Subjective:    Patient ID: Isaac Fisher, male    DOB: 05-13-1985, 32 y.o.   MRN: [id number]  HPI  Pt presents to the clinic today with c/o left lower leg/ankle pain. He reports this started 5 days ago after dropping a log on his left leg. He reports there was some swelling the first 2 days, which has sense improvement. The pain is worse with ambulation. He has tried Ibuprofen and a brace with some relief.  Review of Systems  No past medical history on file.  No current outpatient medications on file.   No current facility-administered medications for this visit.     Allergies  Allergen Reactions  . Amoxicillin     REACTION: tolerates keflex    No family history on file.  Social History   Socioeconomic History  . Marital status: Single    Spouse name: Not on file  . Number of children: Not on file  . Years of education: Not on file  . Highest education level: Not on file  Occupational History  . Not on file  Social Needs  . Financial resource strain: Not on file  . Food insecurity:    Worry: Not on file    Inability: Not on file  . Transportation needs:    Medical: Not on file    Non-medical: Not on file  Tobacco Use  . Smoking status: Never Smoker  . Smokeless tobacco: Never Used  Substance and Sexual Activity  . Alcohol use: Yes  . Drug use: Yes    Comment: marijuana  . Sexual activity: Not on file  Lifestyle  . Physical activity:    Days per week: Not on file    Minutes per session: Not on file  . Stress: Not on file  Relationships  . Social connections:    Talks on phone: Not on file    Gets together: Not on file    Attends religious service: Not on file    Active member of club or organization: Not on file    Attends meetings of clubs or organizations: Not on file    Relationship status: Not on file  . Intimate partner violence:    Fear of current or ex partner: Not on file    Emotionally abused: Not on file    Physically abused: Not on  file    Forced sexual activity: Not on file  Other Topics Concern  . Not on file  Social History Narrative  . Not on file     Constitutional: Denies fever, malaise, fatigue, headache or abrupt weight changes.  Musculoskeletal: Pt reports left foot pain, difficulty with gait. Denies decrease in range of motion, muscle pain.    No other specific complaints in a complete review of systems (except as listed in HPI above).   Objective:   Physical Exam   BP 122/80   Pulse 68   Temp 98.1 F (36.7 C) (Oral)   Wt 240 lb (108.9 kg)   SpO2 97%   BMI 29.02 kg/m  Wt Readings from Last 3 Encounters:  05/03/17 240 lb (108.9 kg)  11/22/15 219 lb 8 oz (99.6 kg)  11/18/15 227 lb 1.9 oz (103 kg)    General: Appears his stated age, well developed, well nourished in NAD. Musculoskeletal: Normal flexion, extension and rotation of the left ankle. Pain with palpation over the left 5th metatarsal. Normal gait, able to walk on toes and heels.  Neurological: Alert and oriented. Sensation intact to BLE.  BMET    Component Value Date/Time   NA 135 11/18/2015 1045   K 4.4 11/18/2015 1045   CL 98 11/18/2015 1045   CO2 30 11/18/2015 1045   GLUCOSE 103 (H) 11/18/2015 1045   BUN 15 11/18/2015 1045   CREATININE 1.22 11/18/2015 1045   CALCIUM 9.5 11/18/2015 1045   GFRNONAA 82 04/03/2007 1433   GFRAA 99 04/03/2007 1433    Lipid Panel     Component Value Date/Time   CHOL 163 03/10/2012 0928   TRIG 100.0 03/10/2012 0928   HDL 39.90 03/10/2012 0928   CHOLHDL 4 03/10/2012 0928   VLDL 20.0 03/10/2012 0928   LDLCALC 103 (H) 03/10/2012 0928    CBC    Component Value Date/Time   WBC 9.2 11/18/2015 1045   RBC 6.10 (H) 11/18/2015 1045   HGB 18.4 Repeated and verified X2. (HH) 11/18/2015 1045   HCT 53.8 (H) 11/18/2015 1045   PLT 154.0 11/18/2015 1045   MCV 88.2 11/18/2015 1045   MCHC 34.1 11/18/2015 1045   RDW 13.2 11/18/2015 1045   LYMPHSABS 0.8 11/18/2015 1045   MONOABS 0.9 11/18/2015  1045   EOSABS 0.1 11/18/2015 1045   BASOSABS 0.0 11/18/2015 1045    Hgb A1C No results found for: HGBA1C         Assessment & Plan:   Left Foot Pain s/p Trauma  Xray left foot today Discussed RICE  Will follow up after xray, return precautions discussed Nicki ReaperBAITY, REGINA, NP

## 2017-05-03 NOTE — Patient Instructions (Signed)
RICE for Routine Care of Injuries Many injuries can be cared for using rest, ice, compression, and elevation (RICE therapy). Using RICE therapy can help to lessen pain and swelling. It can help your body to heal. Rest Reduce your normal activities and avoid using the injured part of your body. You can go back to your normal activities when you feel okay and your doctor says it is okay. Ice Do not put ice on your bare skin.  Put ice in a plastic bag.  Place a towel between your skin and the bag.  Leave the ice on for 20 minutes, 2-3 times a day.  Do this for as long as told by your doctor. Compression Compression means putting pressure on the injured area. This can be done with an elastic bandage. If an elastic bandage has been applied:  Remove and reapply the bandage every 3-4 hours or as told by your doctor.  Make sure the bandage is not wrapped too tight. Wrap the bandage more loosely if part of your body beyond the bandage is blue, swollen, cold, painful, or loses feeling (numb).  See your doctor if the bandage seems to make your problems worse.  Elevation Elevation means keeping the injured area raised. Raise the injured area above your heart or the center of your chest if you can. When should I get help? You should get help if:  You keep having pain and swelling.  Your symptoms get worse.  Get help right away if: You should get help right away if:  You have sudden bad pain at or below the area of your injury.  You have redness or more swelling around your injury.  You have tingling or numbness at or below the injury that does not go away when you take off the bandage.  This information is not intended to replace advice given to you by your health care provider. Make sure you discuss any questions you have with your health care provider. Document Released: 06/27/2007 Document Revised: 12/06/2015 Document Reviewed: 12/16/2013 Elsevier Interactive Patient Education  2017  Elsevier Inc.  

## 2018-09-23 ENCOUNTER — Other Ambulatory Visit: Payer: Self-pay

## 2018-09-23 DIAGNOSIS — Z20822 Contact with and (suspected) exposure to covid-19: Secondary | ICD-10-CM

## 2018-09-25 LAB — NOVEL CORONAVIRUS, NAA: SARS-CoV-2, NAA: DETECTED — AB

## 2018-10-02 ENCOUNTER — Other Ambulatory Visit: Payer: Self-pay

## 2018-10-02 DIAGNOSIS — Z20822 Contact with and (suspected) exposure to covid-19: Secondary | ICD-10-CM

## 2018-10-03 LAB — NOVEL CORONAVIRUS, NAA: SARS-CoV-2, NAA: NOT DETECTED

## 2019-01-30 IMAGING — DX DG FOOT COMPLETE 3+V*L*
3 series · 3 of 3 positions shown · non-contrast
Comparison: None.

CLINICAL DATA: Left foot pain for 5 days since an unspecified
injury. Initial encounter.

EXAM:
LEFT FOOT - COMPLETE 3+ VIEW

[foot ap]
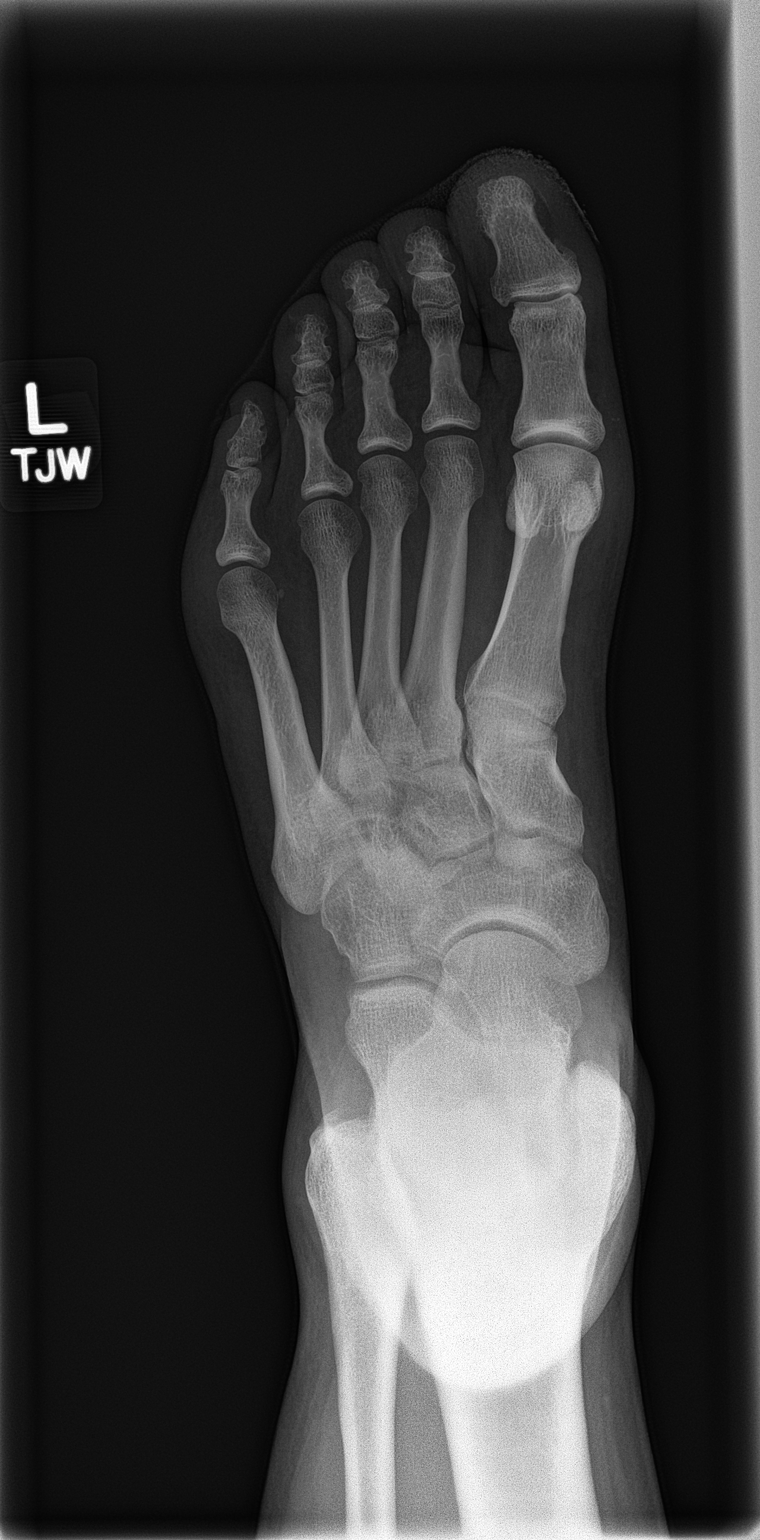

[foot obl]
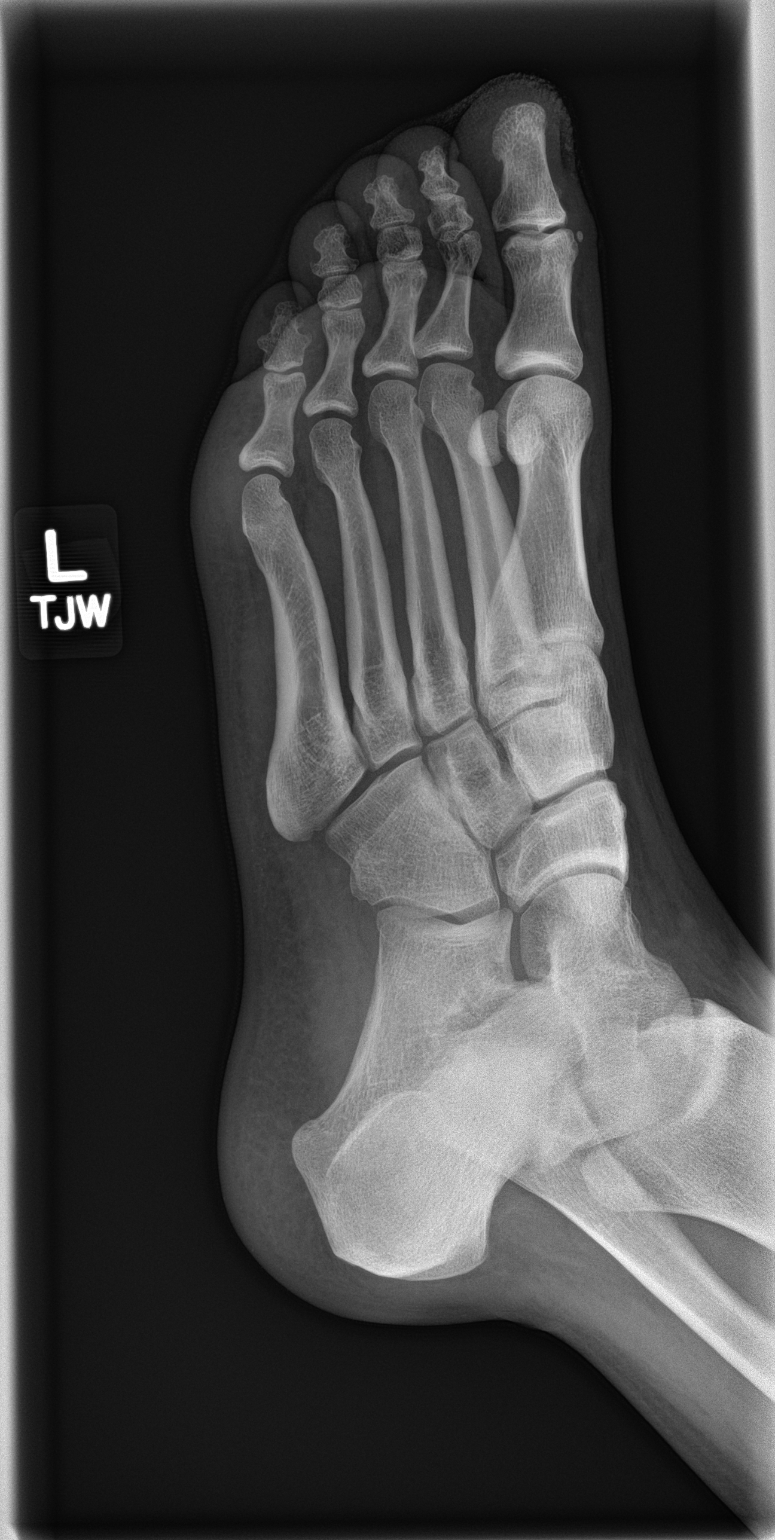

[foot lat]
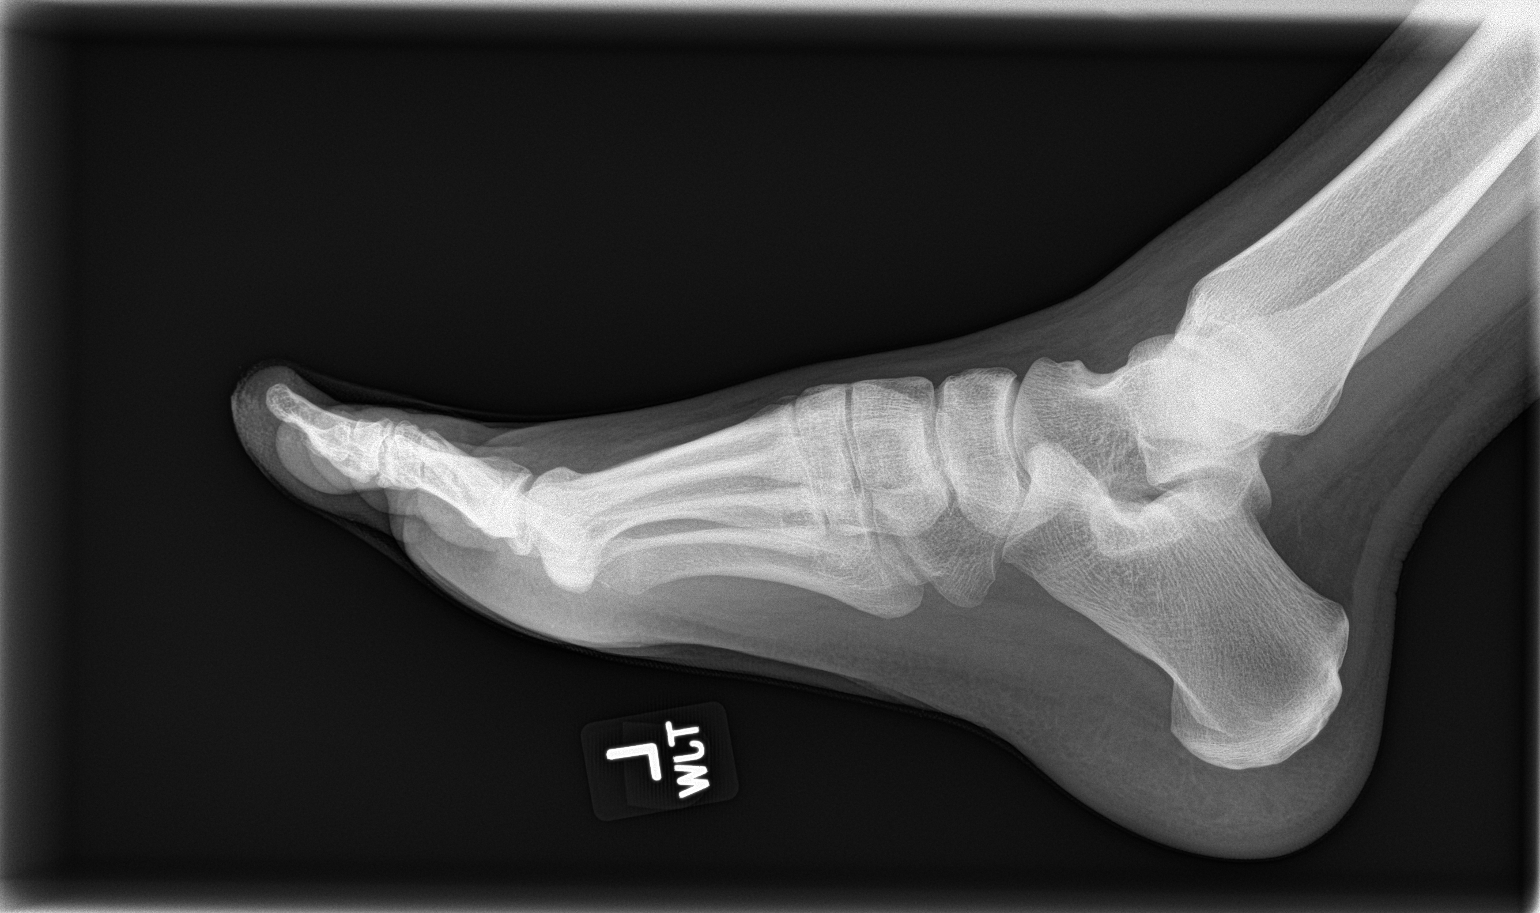

[3 of 3 positions shown; findings below may reference images not displayed]

FINDINGS: There is no evidence of fracture or dislocation. There is no
evidence of arthropathy or other focal bone abnormality. Soft
tissues are unremarkable.
IMPRESSION: Negative exam.

## 2020-08-14 ENCOUNTER — Emergency Department
Admission: EM | Admit: 2020-08-14 | Discharge: 2020-08-14 | Disposition: A | Discharge status: Home / Self Care | Payer: 59 | Attending: Emergency Medicine | Admitting: Emergency Medicine

## 2020-08-14 ENCOUNTER — Other Ambulatory Visit: Payer: Self-pay

## 2020-08-14 DIAGNOSIS — S51812A Laceration without foreign body of left forearm, initial encounter: Secondary | ICD-10-CM

## 2020-08-14 DIAGNOSIS — Z23 Encounter for immunization: Secondary | ICD-10-CM | POA: Diagnosis not present

## 2020-08-14 DIAGNOSIS — W268XXA Contact with other sharp object(s), not elsewhere classified, initial encounter: Secondary | ICD-10-CM | POA: Insufficient documentation

## 2020-08-14 DIAGNOSIS — S59912A Unspecified injury of left forearm, initial encounter: Secondary | ICD-10-CM | POA: Diagnosis present

## 2020-08-14 MED ORDER — SULFAMETHOXAZOLE-TRIMETHOPRIM 800-160 MG PO TABS: 1.0000 | ORAL_TABLET | Freq: Two times a day (BID) | ORAL | 0 refills | Status: AC

## 2020-08-14 MED ORDER — SULFAMETHOXAZOLE-TRIMETHOPRIM 800-160 MG PO TABS
1.0000 | ORAL_TABLET | Freq: Once | ORAL | Status: AC
  Administered 2020-08-14: 1 via ORAL
  Filled 2020-08-14: qty 1

## 2020-08-14 MED ORDER — TETANUS-DIPHTH-ACELL PERTUSSIS 5-2.5-18.5 LF-MCG/0.5 IM SUSY
0.5000 mL | PREFILLED_SYRINGE | Freq: Once | INTRAMUSCULAR | Status: AC
  Administered 2020-08-14: 0.5 mL via INTRAMUSCULAR
  Filled 2020-08-14: qty 0.5

## 2020-08-14 MED ORDER — LIDOCAINE HCL (PF) 1 % IJ SOLN
5.0000 mL | Freq: Once | INTRAMUSCULAR | Status: AC
  Administered 2020-08-14: 5 mL
  Filled 2020-08-14: qty 5

## 2020-08-14 NOTE — ED Notes (Signed)
See triage note - pt presents after sustaining laceration to left elbow/forearm area. Wound is not bleeding at this time.

## 2020-08-14 NOTE — ED Triage Notes (Signed)
Patient reports laceration to L forearm after being cut by some metal fencing. Patient reports unknown date of last tetanus shot. Laceration bandaged in triage.

## 2020-08-14 NOTE — ED Provider Notes (Signed)
[facility] EMERGENCY DEPARTMENT Provider Note   CSN: [id number] Arrival date & time: 08/14/20  2045     History Chief Complaint  Patient presents with   Laceration    Isaac Fisher is a 35 y.o. male presents to the emergency department evaluation of a left forearm laceration.  Patient suffered a left forearm laceration just prior to arrival.  Forearm was cut on a metal fence.  Tetanus status unknown.  Denies any pain numbness or tingling.  No other injury to his body.  HPI     No past medical history on file.  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     No family history on file.  Social History   Tobacco Use   Smoking status: Never   Smokeless tobacco: Never  Substance Use Topics   Alcohol use: Yes   Drug use: Yes    Comment: marijuana    Home Medications Prior to Admission medications   Not on File    Allergies    Amoxicillin  Review of Systems   Review of Systems  Musculoskeletal:  Negative for arthralgias.  Skin:  Positive for wound. Negative for rash.  Neurological:  Negative for numbness.   Physical Exam Updated Vital Signs BP 132/81   Pulse 74   Temp 97.8 F (36.6 C) (Oral)   Resp 16   Ht 6\' 5"  (1.956 m)   Wt 99.8 kg   SpO2 99%   BMI 26.09 kg/m   Physical Exam Constitutional:      Appearance: Isaac Fisher is well-developed.  HENT:     Head: Normocephalic and atraumatic.  Eyes:     Conjunctiva/sclera: Conjunctivae normal.  Cardiovascular:     Rate and Rhythm: Normal rate.  Pulmonary:     Effort: Pulmonary effort is normal. No respiratory distress.  Musculoskeletal:        General: Normal range of motion.     Cervical back: Normal range of motion.     Comments: Normal range of motion left upper extremity with 5 cm V-shaped laceration to the volar proximal forearm.  No muscle involvement.  Normal flexion and extension of the digits.  Normal  sensation with no motor deficits.  No visible foreign body.  Skin:    General: Skin is warm.     Findings: No rash.  Neurological:     Mental Status: Isaac Fisher is alert and oriented to person, place, and time.  Psychiatric:        Behavior: Behavior normal.        Thought Content: Thought content normal.    ED Results / Procedures / Treatments   Labs (all labs ordered are listed, but only abnormal results are displayed) Labs Reviewed - No data to display  EKG None  Radiology No results found.  Procedures . Laceration Repair  Date/Time: 08/14/2020 9:32 PM Performed by: 08/16/2020, PA-C Authorized by: Evon Slack, PA-C   Consent:    Consent obtained:  Verbal   Consent given by:  Patient Anesthesia:    Anesthesia method:  Topical application and local infiltration   Local anesthetic:  Lidocaine 1% w/o epi Laceration details:    Length (cm):  5 Exploration:    Limited defect created (wound extended): no     Wound exploration: wound explored through full range of motion and entire depth of wound visualized     Contaminated: no  Treatment:    Area cleansed with:  Povidone-iodine and saline   Amount of cleaning:  Extensive   Irrigation volume:  100   Irrigation method:  Pressure wash   Debridement:  None Skin repair:    Repair method:  Sutures   Suture size:  5-0   Suture material:  Nylon   Suture technique:  Simple interrupted   Number of sutures:  13 Approximation:    Approximation:  Close Repair type:    Repair type:  Simple Post-procedure details:    Dressing:  Bulky dressing and non-adherent dressing   Procedure completion:  Tolerated   Medications Ordered in ED Medications  lidocaine (PF) (XYLOCAINE) 1 % injection 5 mL (has no administration in time range)  Tdap (BOOSTRIX) injection 0.5 mL (has no administration in time range)  sulfamethoxazole-trimethoprim (BACTRIM DS) 800-160 MG per tablet 1 tablet (has no administration in time range)    ED  Course  I have reviewed the triage vital signs and the nursing notes.  Pertinent labs & imaging results that were available during my care of the patient were reviewed by me and considered in my medical decision making (see chart for details).    MDM Rules/Calculators/A&P                           35 year old male with laceration to the left forearm.  Neurovascular intact with no tendon deficits.  No visible palpable foreign body.  Tetanus is updated in the ED.  Laceration thoroughly irrigated and repaired with thirteen 5-0 nylon sutures.  Isaac Fisher is placed on a prophylactic antibiotic and Isaac Fisher understands wound care and signs and symptoms return to the ER for.  Isaac Fisher will follow-up in 10 to 12 days for suture removal Final Clinical Impression(s) / ED Diagnoses Final diagnoses:  Forearm laceration, left, initial encounter    Rx / DC Orders ED Discharge Orders     None        Ronnette Juniper 08/14/20 2234    Shaune Pollack, MD 08/15/20 1511

## 2020-08-14 NOTE — Discharge Instructions (Addendum)
You may shower and get the laceration site wet.  Do not submerge underwater.  Keep laceration site clean and dry during the day and at night.  Take antibiotics as prescribed.  Follow-up with primary care provider, walk-in clinic and 10 to 12 days for suture removal.  Return to the ER for any swelling warmth redness or pain

## 2020-08-25 ENCOUNTER — Ambulatory Visit (INDEPENDENT_AMBULATORY_CARE_PROVIDER_SITE_OTHER): Payer: 59 | Admitting: Primary Care

## 2020-08-25 ENCOUNTER — Other Ambulatory Visit: Payer: Self-pay

## 2020-08-25 ENCOUNTER — Telehealth: Payer: Self-pay

## 2020-08-25 ENCOUNTER — Encounter: Payer: Self-pay | Admitting: Primary Care

## 2020-08-25 DIAGNOSIS — R531 Weakness: Secondary | ICD-10-CM | POA: Insufficient documentation

## 2020-08-25 DIAGNOSIS — Z4802 Encounter for removal of sutures: Secondary | ICD-10-CM | POA: Diagnosis not present

## 2020-08-25 NOTE — Telephone Encounter (Signed)
Per appt notes pt already scheduled for appt with Allayne Gitelman NP 08/25/20 at 11:20.

## 2020-08-25 NOTE — Telephone Encounter (Signed)
[facility] Day - Client TELEPHONE ADVICE RECORD Access[employer]urse Patient Name: Isaac Fisher Gender: Male DOB: [birth year] Age: 35 Y 6 M 29 D Return Phone Number: [phone] (Primary), [phone] (Secondary) Address: [place] [address] Client [facility] Day - Client Client Site [facility] - Day Physician Copland, Karleen Hampshire - MD Contact Type Call Who Is Calling Patient / Member / Family / Caregiver Call Type Triage / Clinical Caller Name Isaac Fisher Relationship To Patient Mother Return Phone Number [phone] (Primary) Chief Complaint Headache Reason for Call Symptomatic / Request for Health Information Initial Comment Caller states he fell on a post and now has a gash on his arms. Caller states he was seen and the wound has been stitched. Caller states he is now experiencing weakness, headaches and loss of appetite. Caller states he has stopped his medication due to side effects. Translation No Nurse Assessment Nurse: Darlin Coco, RN, Charyl Dancer Date/Time (Eastern Time): 08/25/2020 8:38:09 AM Confirm and document reason for call. If symptomatic, describe symptoms. ---Caller states he has been having weakness, pulsating random headaches (noted with activity), & loss of appetite; first noted about a week ago. Denies fever or any other symptoms. States he has a laceration to his left arm with stitches, happened a week ago this past Sunday. Does the patient have any new or worsening symptoms? ---Yes Will a triage be completed? ---Yes Related visit to physician within the last 2 weeks? ---N/A Does the PT have any chronic conditions? (i.e. diabetes, asthma, this includes High risk factors for pregnancy, etc.) ---Unknown Is this a behavioral health or substance abuse call? ---No Guidelines Guideline Title Affirmed Question Affirmed Notes Nurse Date/Time  (Eastern Time) Weakness (Generalized) and Fatigue [1] MODERATE weakness (i.e., interferes with work, school, normal activities) AND Teaching laboratory technician, RN, Charyl Dancer 08/25/2020 8:42:04 AM PLEASE NOTE: All timestamps contained within this report are represented as [place] Standard Time. CONFIDENTIALTY NOTICE: This fax transmission is intended only for the addressee. It contains information that is legally privileged, confidential or otherwise protected from use or disclosure. If you are not the intended recipient, you are strictly prohibited from reviewing, disclosing, copying using or disseminating any of this information or taking any action in reliance on or regarding this information. If you have received this fax in error, please notify us immediately by telephone so that we can arrange for its return to [place]. Phone: [phone], Toll-Free: [phone], Fax: [phone] Page: 2 of 2 Call Id: [id number] Guidelines Guideline Title Affirmed Question Affirmed Notes Nurse Date/Time Lamount Cohen Time) [2] cause unknown (Exceptions: weakness with acute minor illness, or weakness from poor fluid intake) Disp. Time Lamount Cohen Time) Disposition Final User 08/25/2020 8:44:56 AM See HCP within 4 Hours (or PCP triage) Yes Cazares, RN, Charyl Dancer Caller Disagree/Comply Comply Caller Understands Yes PreDisposition InappropriateToAsk Care Advice Given Per Guideline SEE HCP (OR PCP TRIAGE) WITHIN 4 HOURS: CALL BACK IF: * You become worse CARE ADVICE given per Weakness and Fatigue (Adult) guideline. Comments User: Rexene Agent, RN Date/Time Lamount Cohen Time): 08/25/2020 8:48:46 AM Per client directives: HCP 4hrs, called office backline [phone] & spoke with Irving Burton. Warm transferred caller to [facility]. Referrals Warm transfer to backline

## 2020-08-25 NOTE — Telephone Encounter (Signed)
Patient evaluated.  

## 2020-08-25 NOTE — Progress Notes (Signed)
Subjective:    Patient ID: Isaac Fisher, male    DOB: 20-Apr-1985, 35 y.o.   MRN: [id number]  HPI  [name] is a very pleasant 35 y.o. male patient of Dr. Patsy Lager who presents today for suture removal and weakness.   He sustained a laceration to his left anterior forearm from a metal fence post on 08/14/20. Treated at [facility] ED that same day, has 13 sutures placed, tetanus vaccine was updated, was treated with Bactrim DS tablets.   He started his antibiotics on 08/16/20 and has since been feeling weakness, headaches, decreased appetite, fatigue, lower back pain. He's tested negative for Covid-19 twice. He suspects his symptoms were from Bactrim tablets as he stopped his medication two days ago and symptoms have improved some.   He did notice one suture come out so he has 12 remaninig. He works in [employer], outdoors all day in the heat, tries to stay hydrated with water.    Review of Systems  Constitutional:  Positive for fatigue. Negative for fever.  HENT:  Negative for congestion.   Respiratory:  Negative for cough.   Musculoskeletal:  Positive for back pain.  Neurological:  Positive for headaches.        History reviewed. No pertinent past medical history.  Social History   Socioeconomic History   Marital status: Single    Spouse name: Not on file   Number of children: Not on file   Years of education: Not on file   Highest education level: Not on file  Occupational History   Not on file  Tobacco Use   Smoking status: Never   Smokeless tobacco: Never  Substance and Sexual Activity   Alcohol use: Yes   Drug use: Yes    Comment: marijuana   Sexual activity: Not on file  Other Topics Concern   Not on file  Social History Narrative   Not on file   Social Determinants of Health   Financial Resource Strain: Not on file  Food Insecurity: Not on file  Transportation Needs: Not on file  Physical Activity: Not on file  Stress: Not on file  Social  Connections: Not on file  Intimate Partner Violence: Not on file    History reviewed. No pertinent surgical history.  History reviewed. No pertinent family history.  Allergies  Allergen Reactions   Amoxicillin     REACTION: tolerates keflex    No current outpatient medications on file prior to visit.   No current facility-administered medications on file prior to visit.    BP 120/76   Pulse 76   Temp 98.6 F (37 C) (Temporal)   Ht 6\' 5"  (1.956 m)   Wt 233 lb (105.7 kg)   SpO2 97%   BMI 27.63 kg/m  Objective:   Physical Exam Cardiovascular:     Rate and Rhythm: Normal rate and regular rhythm.  Pulmonary:     Effort: Pulmonary effort is normal.     Breath sounds: Normal breath sounds. No wheezing or rales.  Musculoskeletal:     Cervical back: Neck supple.  Skin:    General: Skin is warm and dry.     Comments: 12 sutures in place to left anterior forearm medial to elbow.  No surrounding erythema, wound healing appropriately. Scabbing to site.  Neurological:     Mental Status: He is alert and oriented to person, place, and time.          Assessment & Plan:  This visit occurred during the SARS-CoV-2 public health emergency.  Safety protocols were in place, including screening questions prior to the visit, additional usage of staff PPE, and extensive cleaning of exam room while observing appropriate contact time as indicated for disinfecting solutions.

## 2020-08-25 NOTE — Patient Instructions (Signed)
Please notify me if your symptoms do not continue to improve.   Keep the site clean and dry.   It was a pleasure to see you today!

## 2020-08-25 NOTE — Assessment & Plan Note (Signed)
After sustaining laceration to left anterior forearm on 08/14/20. Tetanus vaccine has been updated. Wound is healing without signs of infection.   Discussed home care instructions. Return precautions provided.

## 2020-08-25 NOTE — Assessment & Plan Note (Signed)
Suspect secondary to Bactrim DS tablets as symptoms are improving. 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   If symptoms do not continue to improve then recommend lab work up. He will update.

## 2021-06-06 ENCOUNTER — Encounter: Payer: Self-pay | Admitting: Family

## 2021-06-06 ENCOUNTER — Ambulatory Visit: Payer: No Typology Code available for payment source | Admitting: Family

## 2021-06-06 VITALS — BP 128/70 | HR 89 | Temp 98.2°F | Resp 16 | Ht 77.0 in | Wt 238.1 lb

## 2021-06-06 DIAGNOSIS — J020 Streptococcal pharyngitis: Secondary | ICD-10-CM | POA: Diagnosis not present

## 2021-06-06 DIAGNOSIS — J029 Acute pharyngitis, unspecified: Secondary | ICD-10-CM | POA: Insufficient documentation

## 2021-06-06 LAB — POCT RAPID STREP A (OFFICE): Rapid Strep A Screen: POSITIVE — AB

## 2021-06-06 MED ORDER — CEPHALEXIN 500 MG PO CAPS: 500.0000 mg | ORAL_CAPSULE | Freq: Two times a day (BID) | ORAL | 0 refills | Status: AC

## 2021-06-06 NOTE — Assessment & Plan Note (Addendum)
Strep tested in office, positive Warm salt water gargles   

## 2021-06-06 NOTE — Progress Notes (Signed)
? ?Established Patient Office Visit ? ?Subjective:  ?Patient ID: Isaac Fisher, male    DOB: [birth year]  Age: 36 y.o. MRN: [id number] ? ?CC:  ?Chief Complaint  ?Patient presents with  ? Sore Throat  ?  Tested + for covid 3 weeks ago  ? Muscle Pain  ?  X 3 days   ? ? ?HPI ?Isaac Fisher is here today with concerns.  ? ?Positive covid three weeks ago, has been better for about two weeks, then over the weekend started with sore throat and body aches. Sx have been for the past five days.  ? ?Taking mucinex which is helping cough up the mucous.  ? ? ?No past medical history on file. ? ?No past surgical history on file. ? ?No family history on file. ? ?Social History  ? ?Socioeconomic History  ? Marital status: Single  ?  Spouse name: Not on file  ? Number of children: Not on file  ? Years of education: Not on file  ? Highest education level: Not on file  ?Occupational History  ? Not on file  ?Tobacco Use  ? Smoking status: Never  ? Smokeless tobacco: Never  ?Substance and Sexual Activity  ? Alcohol use: Yes  ? Drug use: Yes  ?  Comment: marijuana  ? Sexual activity: Not on file  ?Other Topics Concern  ? Not on file  ?Social History Narrative  ? Not on file  ? ?Social Determinants of Health  ? ?Financial Resource Strain: Not on file  ?Food Insecurity: Not on file  ?Transportation Needs: Not on file  ?Physical Activity: Not on file  ?Stress: Not on file  ?Social Connections: Not on file  ?Intimate Partner Violence: Not on file  ? ? ?No outpatient medications prior to visit.  ? ?No facility-administered medications prior to visit.  ? ? ?Allergies  ?Allergen Reactions  ? Amoxicillin Rash  ?  REACTION: tolerates keflex  ? ? ?ROS ?Review of Systems  ?Constitutional:  Positive for appetite change (decreased appetite, slowly improving) and fatigue. Negative for chills and fever.  ?HENT:  Positive for postnasal drip and sore throat. Negative for congestion, ear pain, rhinorrhea, sinus pressure, sinus pain and sneezing.    ?Eyes:  Negative for discharge.  ?Respiratory:  Positive for cough (nonproductive). Negative for chest tightness, shortness of breath and wheezing.   ?Cardiovascular:  Negative for chest pain and palpitations.  ? ?  ?Objective:  ?  ?Physical Exam ?Constitutional:   ?   General: He is awake. He is not in acute distress. ?   Appearance: Normal appearance. He is well-developed and normal weight. He is not ill-appearing, toxic-appearing or diaphoretic.  ?HENT:  ?   Right Ear: Tympanic membrane normal.  ?   Left Ear: Tympanic membrane normal.  ?   Nose: Mucosal edema and congestion present.  ?   Right Turbinates: Not enlarged or swollen.  ?   Left Turbinates: Not enlarged or swollen.  ?   Right Sinus: No maxillary sinus tenderness or frontal sinus tenderness.  ?   Left Sinus: No maxillary sinus tenderness or frontal sinus tenderness.  ?   Mouth/Throat:  ?   Mouth: Mucous membranes are moist.  ?   Pharynx: Posterior oropharyngeal erythema present. No pharyngeal swelling or oropharyngeal exudate.  ?   Tonsils: 1+ on the right. 1+ on the left.  ?Eyes:  ?   Extraocular Movements: Extraocular movements intact.  ?   Pupils: Pupils are equal, round, and reactive to  light.  ?Cardiovascular:  ?   Rate and Rhythm: Normal rate and regular rhythm.  ?Pulmonary:  ?   Effort: Pulmonary effort is normal.  ?   Breath sounds: Normal breath sounds. No wheezing.  ?Neurological:  ?   Mental Status: He is alert.  ? ? ?BP 128/70   Pulse 89   Temp 98.2 ?F (36.8 ?C)   Resp 16   Ht 6\' 5"  (1.956 m)   Wt 238 lb 2 oz (108 kg)   SpO2 96%   BMI 28.24 kg/m?  ?Wt Readings from Last 3 Encounters:  ?06/06/21 238 lb 2 oz (108 kg)  ?08/25/20 233 lb (105.7 kg)  ?08/14/20 220 lb (99.8 kg)  ? ? ? ?Health Maintenance Due  ?Topic Date Due  ? COVID-19 Vaccine (1) Never done  ? Hepatitis C Screening  Never done  ? ? ?There are no preventive care reminders to display for this patient. ? ?No results found for: TSH ?Lab Results  ?Component Value Date  ? WBC  9.2 11/18/2015  ? HGB 18.4 Repeated and verified X2. (HH) 11/18/2015  ? HCT 53.8 (H) 11/18/2015  ? MCV 88.2 11/18/2015  ? PLT 154.0 11/18/2015  ? ?Lab Results  ?Component Value Date  ? NA 135 11/18/2015  ? K 4.4 11/18/2015  ? CO2 30 11/18/2015  ? GLUCOSE 103 (H) 11/18/2015  ? BUN 15 11/18/2015  ? CREATININE 1.22 11/18/2015  ? BILITOT 0.5 11/18/2015  ? ALKPHOS 69 11/18/2015  ? AST 33 11/18/2015  ? ALT 34 11/18/2015  ? PROT 7.9 11/18/2015  ? ALBUMIN 4.6 11/18/2015  ? CALCIUM 9.5 11/18/2015  ? GFR 74.74 11/18/2015  ? ?No results found for: HGBA1C ? ?  ?Assessment & Plan:  ? ?Problem List Items Addressed This Visit   ? ?  ? Respiratory  ? Strep pharyngitis  ?  rx cephalexin 500 mg po bid x 10 days ?Strep tested positive in office.  ?Ibuprofen/tyelnol prn sore throat/fever ?Pt told to F/u if no improvement in the next 2-3 days. ? ?  ?  ? Relevant Medications  ? cephALEXin (KEFLEX) 500 MG capsule  ?  ? Other  ? Sore throat - Primary  ?  Strep tested in office, positive ?Warm salt water gargles  ? ?  ?  ? Relevant Medications  ? cephALEXin (KEFLEX) 500 MG capsule  ? Other Relevant Orders  ? POCT rapid strep A  ? ? ?Meds ordered this encounter  ?Medications  ? cephALEXin (KEFLEX) 500 MG capsule  ?  Sig: Take 1 capsule (500 mg total) by mouth 2 (two) times daily for 10 days.  ?  Dispense:  20 capsule  ?  Refill:  0  ?  Order Specific Question:   Supervising Provider  ?  Answer:   BEDSOLE, AMY E [[id number]]  ? ? ?Follow-up: Return if symptoms worsen or fail to improve with pcp.  ? ? ?11/20/2015, FNP ?

## 2021-06-06 NOTE — Assessment & Plan Note (Signed)
rx cephalexin 500 mg po bid x 10 days ?Strep tested positive in office.  ?Ibuprofen/tyelnol prn sore throat/fever ?Pt told to F/u if no improvement in the next 2-3 days. ? ?

## 2021-06-06 NOTE — Patient Instructions (Signed)
You were found to be strep positive,  Take antibiotics that have been sent to the pharmacy.  Change your toothbrush after 24 hours on the antibiotics.  Gargle with warm salt water as needed for sore throat.   Due to recent changes in healthcare laws, you may see results of your imaging and/or laboratory studies on MyChart before I have had a chance to review them.  I understand that in some cases there may be results that are confusing or concerning to you. Please understand that not all results are received at the same time and often I may need to interpret multiple results in order to provide you with the best plan of care or course of treatment. Therefore, I ask that you please give me 2 business days to thoroughly review all your results before contacting my office for clarification. Should we see a critical lab result, you will be contacted sooner.   It was a pleasure seeing you today! Please do not hesitate to reach out with any questions and or concerns.  Regards,   Dedrick Heffner FNP-C    

## 2022-07-31 ENCOUNTER — Ambulatory Visit: Payer: BC Managed Care – PPO | Admitting: Internal Medicine

## 2022-07-31 ENCOUNTER — Encounter: Payer: Self-pay | Admitting: Internal Medicine

## 2022-07-31 VITALS — BP 102/80 | HR 68 | Temp 97.6°F | Ht 76.0 in | Wt 242.0 lb

## 2022-07-31 DIAGNOSIS — H6122 Impacted cerumen, left ear: Secondary | ICD-10-CM | POA: Diagnosis not present

## 2022-07-31 DIAGNOSIS — H6120 Impacted cerumen, unspecified ear: Secondary | ICD-10-CM | POA: Insufficient documentation

## 2022-07-31 MED ORDER — NEOMYCIN-POLYMYXIN-HC 3.5-10000-1 OT SUSP: 4.0000 [drp] | Freq: Three times a day (TID) | OTIC | 0 refills | Status: DC

## 2022-07-31 NOTE — Assessment & Plan Note (Addendum)
Possible mild inflammation but not really otitis externa Will flush out--if no pain with that--will not need any other Rx Symptoms better after clearing out Will give drops for prn use after swimming though

## 2022-07-31 NOTE — Progress Notes (Signed)
   Subjective:    Patient ID: Isaac Fisher, male    DOB: 22-Dec-1985, 37 y.o.   MRN: [id number]  HPI Here with possible swimmer's ear  Feels like he still has water in his ear--after being at the beach in the past week Only swimming in the ocean Tried Q-tips to try to open it up--but may have pushed in wax Not really painful---but hears water sloshing Only in left ear  No current outpatient medications on file prior to visit.   No current facility-administered medications on file prior to visit.    Allergies  Allergen Reactions   Amoxicillin Rash    REACTION: tolerates keflex    History reviewed. No pertinent past medical history.  History reviewed. No pertinent surgical history.  History reviewed. No pertinent family history.  Social History   Socioeconomic History   Marital status: Single    Spouse name: Not on file   Number of children: Not on file   Years of education: Not on file   Highest education level: Not on file  Occupational History   Not on file  Tobacco Use   Smoking status: Never    Passive exposure: Never   Smokeless tobacco: Never  Substance and Sexual Activity   Alcohol use: Yes   Drug use: Yes    Comment: marijuana   Sexual activity: Not on file  Other Topics Concern   Not on file  Social History Narrative   Not on file   Social Determinants of Health   Financial Resource Strain: Not on file  Food Insecurity: Not on file  Transportation Needs: Not on file  Physical Activity: Not on file  Stress: Not on file  Social Connections: Not on file  Intimate Partner Violence: Not on file   Review of Systems No tinnitus No loss of hearing    Objective:   Physical Exam Constitutional:      Appearance: Normal appearance.  HENT:     Ears:     Comments: No tragal tenderness AU Right TM/canal normal Left TM blocked with deep cerumen---?possible mild inflammation distal to the wax in canal Neurological:     Mental Status: He is  alert.            Assessment & Plan:

## 2023-06-07 ENCOUNTER — Telehealth: Payer: Self-pay | Admitting: *Deleted

## 2023-06-07 DIAGNOSIS — Z131 Encounter for screening for diabetes mellitus: Secondary | ICD-10-CM

## 2023-06-07 DIAGNOSIS — R5383 Other fatigue: Secondary | ICD-10-CM

## 2023-06-07 DIAGNOSIS — Z1159 Encounter for screening for other viral diseases: Secondary | ICD-10-CM

## 2023-06-07 DIAGNOSIS — Z13220 Encounter for screening for lipoid disorders: Secondary | ICD-10-CM

## 2023-06-07 NOTE — Telephone Encounter (Signed)
 Yes, that is fine.  I am happy to see him.

## 2023-06-07 NOTE — Telephone Encounter (Signed)
-----   Message from Gerry Krone sent at 06/07/2023  3:33 PM EDT ----- Regarding: Lab order for Wed, 5.21.25 Patient is scheduled for CPX labs, please order future labs, Thanks , Anselmo Kings

## 2023-06-07 NOTE — Telephone Encounter (Signed)
 You have not seen the patient in over 10 years and he has been scheduled with your for CPE.  He has seen other provides for acute appointments.  Are you okay with this?  I placed future lab orders below.  Sign if you are okay with this appointment.  I also don't have a good Dx for CBC w/Diff.

## 2023-06-12 ENCOUNTER — Other Ambulatory Visit (INDEPENDENT_AMBULATORY_CARE_PROVIDER_SITE_OTHER): Payer: Self-pay

## 2023-06-12 DIAGNOSIS — R5383 Other fatigue: Secondary | ICD-10-CM | POA: Diagnosis not present

## 2023-06-12 DIAGNOSIS — Z13220 Encounter for screening for lipoid disorders: Secondary | ICD-10-CM

## 2023-06-12 DIAGNOSIS — Z131 Encounter for screening for diabetes mellitus: Secondary | ICD-10-CM | POA: Diagnosis not present

## 2023-06-12 DIAGNOSIS — Z1159 Encounter for screening for other viral diseases: Secondary | ICD-10-CM

## 2023-06-12 LAB — CBC WITH DIFFERENTIAL/PLATELET
Basophils Absolute: 0 10*3/uL (ref 0.0–0.1)
Basophils Relative: 0.6 % (ref 0.0–3.0)
Eosinophils Absolute: 0.3 10*3/uL (ref 0.0–0.7)
Eosinophils Relative: 3.6 % (ref 0.0–5.0)
HCT: 48.1 % (ref 39.0–52.0)
Hemoglobin: 16.2 g/dL (ref 13.0–17.0)
Lymphocytes Relative: 25 % (ref 12.0–46.0)
Lymphs Abs: 2 10*3/uL (ref 0.7–4.0)
MCHC: 33.7 g/dL (ref 30.0–36.0)
MCV: 87 fl (ref 78.0–100.0)
Monocytes Absolute: 0.8 10*3/uL (ref 0.1–1.0)
Monocytes Relative: 9.9 % (ref 3.0–12.0)
Neutro Abs: 4.9 10*3/uL (ref 1.4–7.7)
Neutrophils Relative %: 60.9 % (ref 43.0–77.0)
Platelets: 214 10*3/uL (ref 150.0–400.0)
RBC: 5.54 Mil/uL (ref 4.22–5.81)
RDW: 13 % (ref 11.5–15.5)
WBC: 8.1 10*3/uL (ref 4.0–10.5)

## 2023-06-12 LAB — BASIC METABOLIC PANEL WITH GFR
BUN: 20 mg/dL (ref 6–23)
CO2: 30 meq/L (ref 19–32)
Calcium: 9.2 mg/dL (ref 8.4–10.5)
Chloride: 105 meq/L (ref 96–112)
Creatinine, Ser: 1.32 mg/dL (ref 0.40–1.50)
GFR: 69.46 mL/min (ref >60.00)
Glucose, Bld: 91 mg/dL (ref 70–99)
Potassium: 4.5 meq/L (ref 3.5–5.1)
Sodium: 142 meq/L (ref 135–145)

## 2023-06-12 LAB — HEPATIC FUNCTION PANEL
ALT: 23 U/L (ref 0–53)
AST: 15 U/L (ref 0–37)
Albumin: 4.6 g/dL (ref 3.5–5.2)
Alkaline Phosphatase: 78 U/L (ref 39–117)
Bilirubin, Direct: 0.1 mg/dL (ref 0.0–0.3)
Total Bilirubin: 0.4 mg/dL (ref 0.2–1.2)
Total Protein: 7.1 g/dL (ref 6.0–8.3)

## 2023-06-12 LAB — LIPID PANEL
Cholesterol: 171 mg/dL (ref 0–200)
HDL: 35 mg/dL — ABNORMAL LOW (ref >39.00)
LDL Cholesterol: 112 mg/dL — ABNORMAL HIGH (ref 0–99)
NonHDL: 135.57
Total CHOL/HDL Ratio: 5
Triglycerides: 116 mg/dL (ref 0.0–149.0)
VLDL: 23.2 mg/dL (ref 0.0–40.0)

## 2023-06-12 LAB — HEMOGLOBIN A1C: Hgb A1c MFr Bld: 5.6 % (ref 4.6–6.5)

## 2023-06-13 LAB — HEPATITIS C ANTIBODY: Hepatitis C Ab: NONREACTIVE

## 2023-06-18 ENCOUNTER — Encounter: Payer: Self-pay | Admitting: Family Medicine

## 2023-06-18 NOTE — Progress Notes (Unsigned)
 Isaac Yau T. [name], MD, CAQ Sports Medicine [facility] at [facility] [address] [place], [address]  Phone: [phone]  FAX: [phone]  SY SAINTJEAN - 38 y.o. male  MRN [id number]  Date of Birth: 04/02/1985  Date: 06/19/2023  PCP: Scherrie Curt, MD  Referral: Scherrie Curt, MD  No chief complaint on file.  Patient Care Team: Scherrie Curt, MD as PCP - General Subjective:   Isaac Fisher is a 38 y.o. pleasant patient who presents with the following:  Preventative Health Maintenance Visit:  Health Maintenance Summary Reviewed and updated, unless pt declines services.  Tobacco History Reviewed. Alcohol: No concerns, no excessive use Exercise Habits: Some activity, rec at least 30 mins 5 times a week STD concerns: no risk or activity to increase risk Drug Use: None  I remember Isaac Fisher well, did see some of his other family members, and I saw him when he was a younger man.  Actually have not seen him in the office since 2014.  COVID vaccination  He previously was on medication for ADD.    Health Maintenance  Topic Date Due   COVID-19 Vaccine (1 - 2024-25 season) Never done   INFLUENZA VACCINE  08/23/2023   DTaP/Tdap/Td (8 - Td or Tdap) 08/15/2030   Hepatitis C Screening  Completed   HIV Screening  Completed   HPV VACCINES  Aged Out   Meningococcal B Vaccine  Aged Out   Immunization History  Administered Date(s) Administered   DTP 04/19/1987, 06/21/1987, 08/16/1987, 05/15/1988, 05/03/1992   HIB (PRP-OMP) 06/21/1987   MMR 05/15/1988, 05/03/1992   OPV 04/19/1987, 06/21/1987, 05/15/1988, 05/03/1992   Td 11/23/1998   Tdap 08/14/2020   Patient Active Problem List   Diagnosis Date Noted   ADD 02/24/2009    Past Medical History:  Diagnosis Date   ADD 02/24/2009   Qualifier: Diagnosis of   By: Geralyn Knee MD, Jolena Nay      Replacing diagnoses that were inactivated after the 04/23/22 regulatory import      No past  surgical history on file.  No family history on file.  Social History   Social History Narrative   Not on file    Past Medical History, Surgical History, Social History, Family History, Problem List, Medications, and Allergies have been reviewed and updated if relevant.  Review of Systems: Pertinent positives are listed above.  Otherwise, a full 14 point review of systems has been done in full and it is negative except where it is noted positive.  Objective:   There were no vitals taken for this visit. Ideal Body Weight:    Ideal Body Weight:   No results found.    07/31/2022   11:01 AM  Depression screen PHQ 2/9  Decreased Interest 0  Down, Depressed, Hopeless 0  PHQ - 2 Score 0     GEN: well developed, well nourished, no acute distress Eyes: conjunctiva and lids normal, PERRLA, EOMI ENT: TM clear, nares clear, oral exam WNL Neck: supple, no lymphadenopathy, no thyromegaly, no JVD Pulm: clear to auscultation and percussion, respiratory effort normal CV: regular rate and rhythm, S1-S2, no murmur, rub or gallop, no bruits, peripheral pulses normal and symmetric, no cyanosis, clubbing, edema or varicosities GI: soft, non-tender; no hepatosplenomegaly, masses; active bowel sounds all quadrants GU: deferred Lymph: no cervical, axillary or inguinal adenopathy MSK: gait normal, muscle tone and strength WNL, no joint swelling, effusions, discoloration, crepitus  SKIN: clear, good turgor, color WNL, no rashes, lesions, or  ulcerations Neuro: normal mental status, normal strength, sensation, and motion Psych: alert; oriented to person, place and time, normally interactive and not anxious or depressed in appearance.  All labs reviewed with patient. Results for orders placed or performed in visit on 06/12/23  CBC with Differential/Platelet   Collection Time: 06/12/23  7:49 AM  Result Value Ref Range   WBC 8.1 4.0 - 10.5 K/uL   RBC 5.54 4.22 - 5.81 Mil/uL   Hemoglobin 16.2 13.0 -  17.0 g/dL   HCT 40.9 81.1 - 91.4 %   MCV 87.0 78.0 - 100.0 fl   MCHC 33.7 30.0 - 36.0 g/dL   RDW 78.2 95.6 - 21.3 %   Platelets 214.0 150.0 - 400.0 K/uL   Neutrophils Relative % 60.9 43.0 - 77.0 %   Lymphocytes Relative 25.0 12.0 - 46.0 %   Monocytes Relative 9.9 3.0 - 12.0 %   Eosinophils Relative 3.6 0.0 - 5.0 %   Basophils Relative 0.6 0.0 - 3.0 %   Neutro Abs 4.9 1.4 - 7.7 K/uL   Lymphs Abs 2.0 0.7 - 4.0 K/uL   Monocytes Absolute 0.8 0.1 - 1.0 K/uL   Eosinophils Absolute 0.3 0.0 - 0.7 K/uL   Basophils Absolute 0.0 0.0 - 0.1 K/uL  Hepatitis C antibody   Collection Time: 06/12/23  7:49 AM  Result Value Ref Range   Hepatitis C Ab NON-REACTIVE NON-REACTIVE  Hemoglobin A1c   Collection Time: 06/12/23  7:49 AM  Result Value Ref Range   Hgb A1c MFr Bld 5.6 4.6 - 6.5 %  Hepatic Function Panel   Collection Time: 06/12/23  7:49 AM  Result Value Ref Range   Total Bilirubin 0.4 0.2 - 1.2 mg/dL   Bilirubin, Direct 0.1 0.0 - 0.3 mg/dL   Alkaline Phosphatase 78 39 - 117 U/L   AST 15 0 - 37 U/L   ALT 23 0 - 53 U/L   Total Protein 7.1 6.0 - 8.3 g/dL   Albumin 4.6 3.5 - 5.2 g/dL  Basic metabolic panel with GFR   Collection Time: 06/12/23  7:49 AM  Result Value Ref Range   Sodium 142 135 - 145 mEq/L   Potassium 4.5 3.5 - 5.1 mEq/L   Chloride 105 96 - 112 mEq/L   CO2 30 19 - 32 mEq/L   Glucose, Bld 91 70 - 99 mg/dL   BUN 20 6 - 23 mg/dL   Creatinine, Ser 0.86 0.40 - 1.50 mg/dL   GFR 57.84 >69.62 mL/min   Calcium 9.2 8.4 - 10.5 mg/dL  Lipid panel   Collection Time: 06/12/23  7:49 AM  Result Value Ref Range   Cholesterol 171 0 - 200 mg/dL   Triglycerides 952.8 0.0 - 149.0 mg/dL   HDL 41.32 (L) >44.01 mg/dL   VLDL 02.7 0.0 - 25.3 mg/dL   LDL Cholesterol 664 (H) 0 - 99 mg/dL   Total CHOL/HDL Ratio 5    NonHDL 135.57     Assessment and Plan:     ICD-10-CM   1. Healthcare maintenance  Z00.00       Health Maintenance Exam: The patient's preventative maintenance and  recommended screening tests for an annual wellness exam were reviewed in full today. Brought up to date unless services declined.  Counselled on the importance of diet, exercise, and its role in overall health and mortality. The patient's FH and SH was reviewed, including their home life, tobacco status, and drug and alcohol status.  Follow-up in 1 year for physical exam or  additional follow-up below.  Disposition: No follow-ups on file.  No orders of the defined types were placed in this encounter.  There are no discontinued medications. No orders of the defined types were placed in this encounter.   Signed,  Ranny Bye. Emireth Cockerham, MD   Allergies as of 06/19/2023       Reactions   Amoxicillin Rash   REACTION: tolerates keflex         Medication List        Accurate as of Jun 18, 2023 10:38 AM. If you have any questions, ask your nurse or doctor.          neomycin -polymyxin-hydrocortisone 3.5-10000-1 OTIC suspension Commonly known as: CORTISPORIN Place 4 drops into both ears 3 (three) times daily.

## 2023-06-19 ENCOUNTER — Encounter: Payer: Self-pay | Admitting: Family Medicine

## 2023-06-19 ENCOUNTER — Ambulatory Visit (INDEPENDENT_AMBULATORY_CARE_PROVIDER_SITE_OTHER): Payer: Self-pay | Admitting: Family Medicine

## 2023-06-19 VITALS — BP 108/70 | HR 69 | Temp 97.3°F | Ht 75.6 in | Wt 246.2 lb

## 2023-06-19 DIAGNOSIS — R531 Weakness: Secondary | ICD-10-CM | POA: Diagnosis not present

## 2023-06-19 DIAGNOSIS — Z0000 Encounter for general adult medical examination without abnormal findings: Secondary | ICD-10-CM | POA: Diagnosis not present

## 2023-06-19 DIAGNOSIS — M2550 Pain in unspecified joint: Secondary | ICD-10-CM

## 2023-06-26 ENCOUNTER — Ambulatory Visit: Payer: Self-pay | Admitting: Family Medicine

## 2023-06-26 LAB — B. BURGDORFI ANTIBODIES BY WB

## 2023-06-26 LAB — ROCKY MTN SPOTTED FVR ABS PNL(IGG+IGM)
RMSF IgG: NOT DETECTED
RMSF IgM: NOT DETECTED

## 2023-06-26 LAB — EHRLICHIA ANTIBODY PANEL
E. CHAFFEENSIS AB IGG: 1:64 {titer} — ABNORMAL HIGH
E. CHAFFEENSIS AB IGM: <1:20 {titer}

## 2024-02-12 NOTE — Progress Notes (Signed)
 "    Isaac Trew T. Mickaela Starlin, MD, CAQ Sports Medicine [facility] at [facility] [address] [place], [address]  Phone: [phone]  FAX: [phone]  Isaac Fisher - 39 y.o. male  MRN [id number]  Date of Birth: 1986-01-08  Date: 02/13/2024  PCP: Watt Mirza, MD  Referral: Watt Mirza, MD  Chief Complaint  Patient presents with   Foreign Body in Skin    Splinter in Right Foot   Subjective:   Isaac Fisher is a 39 y.o. very pleasant male patient with Body mass index is 30.66 kg/m. who presents with the following:  Discussed the use of AI scribe software for clinical note transcription with the patient, who gave verbal consent to proceed.  Patient presents with a splinter in his foot. History of Present Illness Isaac Fisher is a 39 year old male who presents with a persistent splinter in his foot.  He has had a splinter in his foot for approximately two months, described as the tip of a thorn that he has been unable to remove. The area is slightly swollen but not painful unless he is walking barefoot on hard surfaces, where it stings slightly. Attempts to remove it himself using a sharp knife have been unsuccessful. The splinter and callus does not bother him when wearing shoes.  He has a history of removing similar splinters from his hands and arms but has never experienced one in his foot before. The splinter is located in a callused area, and he can feel it when pressing on the spot. The area turns red and becomes agitated when he attempts to remove the splinter.  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    Immunization History  Administered Date(s) Administered   DTP 04/19/1987, 06/21/1987, 08/16/1987, 05/15/1988, 05/03/1992   HIB (PRP-OMP) 06/21/1987   Hep B, Unspecified 11/01/1998, 12/08/1998, 05/09/1999   MMR 05/15/1988, 05/03/1992    OPV 04/19/1987, 06/21/1987, 05/15/1988, 05/03/1992   Td 11/23/1998   Tdap 03/23/2009, 08/14/2020     Review of Systems is noted in the HPI, as appropriate  Objective:   BP 100/70   Pulse 68   Temp (!) 97.5 F (36.4 C) (Temporal)   Ht 6' 3.6 (1.92 m)   Wt 249 lb 4 oz (113.1 kg)   SpO2 98%   BMI 30.66 kg/m   GEN: No acute distress; alert,appropriate. PULM: Breathing comfortably in no respiratory distress PSYCH: Normally interactive.   Roughly underneath the second metatarsal head on the right there is a significant callus, there is also a darkened central area  Laboratory and Imaging Data:  Assessment and Plan:     ICD-10-CM   1. Corn of foot  L84      Assessment & Plan Retained foreign body of foot with callus formation Chronic retained foreign body, likely a thorn tip, with callus formation.  I do not clearly see a splinter or thorn tip, however there is a significant callus with a darkened spot that could correlate with splinter versus corn core  - I debrided the callus with a #15 blade and also got down to the darkened area, but I was not able to remove the splinter   Disposition: As needed only  [employer] speech-to-text software was used for transcription in this dictation.  Possible transcriptional errors can occur using Animal nutritionist.   Signed,  Mirza DASEN. Jazmaine Fuelling, MD  No outpatient encounter medications on file as of 02/13/2024.   No facility-administered encounter medications on file as of 02/13/2024.   "

## 2024-02-13 ENCOUNTER — Ambulatory Visit (INDEPENDENT_AMBULATORY_CARE_PROVIDER_SITE_OTHER): Admitting: Family Medicine

## 2024-02-13 ENCOUNTER — Encounter: Payer: Self-pay | Admitting: Family Medicine

## 2024-02-13 VITALS — BP 100/70 | HR 68 | Temp 97.5°F | Ht 75.6 in | Wt 249.2 lb

## 2024-02-13 DIAGNOSIS — L84 Corns and callosities: Secondary | ICD-10-CM
